# Patient Record
Sex: Female | Born: 1940
Health system: Southern US, Community
[De-identification: ages and names within clinical notes are randomized; demographics above are authoritative.]

## PROBLEM LIST (undated history)

## (undated) DIAGNOSIS — H269 Unspecified cataract: Secondary | ICD-10-CM

## (undated) DIAGNOSIS — K219 Gastro-esophageal reflux disease without esophagitis: Secondary | ICD-10-CM

## (undated) DIAGNOSIS — E7800 Pure hypercholesterolemia, unspecified: Secondary | ICD-10-CM

## (undated) HISTORY — PX: WISDOM TOOTH EXTRACTION: SHX21

## (undated) HISTORY — DX: Unspecified cataract: H26.9

## (undated) HISTORY — PX: TONSILLECTOMY: SUR1361

## (undated) HISTORY — DX: Gastro-esophageal reflux disease without esophagitis: K21.9

## (undated) HISTORY — DX: Pure hypercholesterolemia, unspecified: E78.00

## (undated) HISTORY — PX: CATARACT EXTRACTION: SUR2

## (undated) HISTORY — PX: EYE SURGERY: SHX253

## (undated) HISTORY — PX: COLONOSCOPY: SHX174

---

## 2003-10-30 ENCOUNTER — Emergency Department (HOSPITAL_COMMUNITY): Admission: EM | Admit: 2003-10-30 | Discharge: 2003-10-30 | Payer: Self-pay | Admitting: Family Medicine

## 2005-12-25 ENCOUNTER — Other Ambulatory Visit: Admission: RE | Admit: 2005-12-25 | Discharge: 2005-12-25 | Payer: Self-pay | Admitting: Gynecology

## 2008-04-09 ENCOUNTER — Ambulatory Visit: Payer: Self-pay | Admitting: Women's Health

## 2008-04-09 ENCOUNTER — Other Ambulatory Visit: Admission: RE | Admit: 2008-04-09 | Discharge: 2008-04-09 | Payer: Self-pay | Admitting: Obstetrics and Gynecology

## 2008-04-09 ENCOUNTER — Encounter: Payer: Self-pay | Admitting: Women's Health

## 2008-05-03 ENCOUNTER — Ambulatory Visit (HOSPITAL_COMMUNITY): Admission: RE | Admit: 2008-05-03 | Discharge: 2008-05-03 | Payer: Self-pay | Admitting: Gynecology

## 2009-05-04 ENCOUNTER — Ambulatory Visit (HOSPITAL_COMMUNITY): Admission: RE | Admit: 2009-05-04 | Discharge: 2009-05-04 | Payer: Self-pay | Admitting: Internal Medicine

## 2010-12-21 ENCOUNTER — Other Ambulatory Visit: Payer: Self-pay | Admitting: Women's Health

## 2010-12-21 ENCOUNTER — Encounter (INDEPENDENT_AMBULATORY_CARE_PROVIDER_SITE_OTHER): Payer: Medicare HMO | Admitting: Women's Health

## 2010-12-21 ENCOUNTER — Other Ambulatory Visit (HOSPITAL_COMMUNITY)
Admission: RE | Admit: 2010-12-21 | Discharge: 2010-12-21 | Disposition: A | Discharge status: Home / Self Care | Payer: Medicare HMO | Source: Ambulatory Visit | Attending: Obstetrics and Gynecology | Admitting: Obstetrics and Gynecology

## 2010-12-21 DIAGNOSIS — N951 Menopausal and female climacteric states: Secondary | ICD-10-CM

## 2010-12-21 DIAGNOSIS — Z124 Encounter for screening for malignant neoplasm of cervix: Secondary | ICD-10-CM | POA: Insufficient documentation

## 2010-12-25 ENCOUNTER — Other Ambulatory Visit: Payer: Self-pay | Admitting: Women's Health

## 2010-12-25 DIAGNOSIS — Z1231 Encounter for screening mammogram for malignant neoplasm of breast: Secondary | ICD-10-CM

## 2010-12-26 ENCOUNTER — Encounter: Payer: Self-pay | Admitting: *Deleted

## 2011-01-02 ENCOUNTER — Ambulatory Visit (HOSPITAL_COMMUNITY)
Admission: RE | Admit: 2011-01-02 | Discharge: 2011-01-02 | Disposition: A | Discharge status: Home / Self Care | Payer: Medicare HMO | Source: Ambulatory Visit | Attending: Women's Health | Admitting: Women's Health

## 2011-01-02 DIAGNOSIS — Z1231 Encounter for screening mammogram for malignant neoplasm of breast: Secondary | ICD-10-CM | POA: Insufficient documentation

## 2012-03-31 ENCOUNTER — Other Ambulatory Visit (HOSPITAL_COMMUNITY): Payer: Self-pay | Admitting: Internal Medicine

## 2012-03-31 DIAGNOSIS — Z1231 Encounter for screening mammogram for malignant neoplasm of breast: Secondary | ICD-10-CM

## 2012-04-04 ENCOUNTER — Encounter: Payer: Self-pay | Admitting: Internal Medicine

## 2012-04-17 ENCOUNTER — Ambulatory Visit (HOSPITAL_COMMUNITY)
Admission: RE | Admit: 2012-04-17 | Discharge: 2012-04-17 | Disposition: A | Discharge status: Home / Self Care | Payer: Medicare Other | Source: Ambulatory Visit | Attending: Internal Medicine | Admitting: Internal Medicine

## 2012-04-17 DIAGNOSIS — Z1231 Encounter for screening mammogram for malignant neoplasm of breast: Secondary | ICD-10-CM | POA: Insufficient documentation

## 2012-05-13 ENCOUNTER — Encounter: Payer: Self-pay | Admitting: Internal Medicine

## 2012-05-13 ENCOUNTER — Ambulatory Visit (AMBULATORY_SURGERY_CENTER): Payer: Medicare Other | Admitting: *Deleted

## 2012-05-13 VITALS — Ht 65.0 in | Wt 164.0 lb

## 2012-05-13 DIAGNOSIS — Z1211 Encounter for screening for malignant neoplasm of colon: Secondary | ICD-10-CM

## 2012-05-13 DIAGNOSIS — Z800 Family history of malignant neoplasm of digestive organs: Secondary | ICD-10-CM

## 2012-05-13 MED ORDER — NA SULFATE-K SULFATE-MG SULF 17.5-3.13-1.6 GM/177ML PO SOLN: ORAL | Status: DC

## 2012-05-13 NOTE — Progress Notes (Signed)
Pt had 2 colonoscopies in [place], [place].  Ok to get info from previous procedure received and form given to [place], Dr. Marvell Fuller CMA

## 2012-05-27 ENCOUNTER — Encounter: Payer: Self-pay | Admitting: Internal Medicine

## 2012-05-27 ENCOUNTER — Ambulatory Visit (AMBULATORY_SURGERY_CENTER): Payer: Medicare Other | Admitting: Internal Medicine

## 2012-05-27 VITALS — BP 133/70 | HR 57 | Temp 96.9°F | Resp 26 | Ht 65.0 in | Wt 164.0 lb

## 2012-05-27 DIAGNOSIS — K648 Other hemorrhoids: Secondary | ICD-10-CM

## 2012-05-27 DIAGNOSIS — Z1211 Encounter for screening for malignant neoplasm of colon: Secondary | ICD-10-CM

## 2012-05-27 MED ORDER — SODIUM CHLORIDE 0.9 % IV SOLN: 500.0000 mL | INTRAVENOUS | Status: DC

## 2012-05-27 MED ORDER — HYDROCORTISONE 2.5 % RE CREA: TOPICAL_CREAM | Freq: Two times a day (BID) | RECTAL | Status: DC

## 2012-05-27 NOTE — Patient Instructions (Addendum)
No polyps or cancer seen!  You do have an inflamed internal hemorrhoid. I have prescribed medication to relieve that.   You can consider another routine colonoscopy in 10 years but at 29 that may not be necessary.  Thank you for choosing me and [facility].  Iva Boop, MD, FACG    YOU HAD AN ENDOSCOPIC PROCEDURE TODAY AT THE [facility]: Refer to the procedure report that was given to you for any specific questions about what was found during the examination.  If the procedure report does not answer your questions, please call your gastroenterologist to clarify.  If you requested that your care partner not be given the details of your procedure findings, then the procedure report has been included in a sealed envelope for you to review at your convenience later.  YOU SHOULD EXPECT: Some feelings of bloating in the abdomen. Passage of more gas than usual.  Walking can help get rid of the air that was put into your GI tract during the procedure and reduce the bloating. If you had a lower endoscopy (such as a colonoscopy or flexible sigmoidoscopy) you may notice spotting of blood in your stool or on the toilet paper. If you underwent a bowel prep for your procedure, then you may not have a normal bowel movement for a few days.  DIET: Your first meal following the procedure should be a light meal and then it is ok to progress to your normal diet.  A half-sandwich or bowl of soup is an example of a good first meal.  Heavy or fried foods are harder to digest and may make you feel nauseous or bloated.  Likewise meals heavy in dairy and vegetables can cause extra gas to form and this can also increase the bloating.  Drink plenty of fluids but you should avoid alcoholic beverages for 24 hours.  ACTIVITY: Your care partner should take you home directly after the procedure.  You should plan to take it easy, moving slowly for the rest of the day.  You can resume normal activity  the day after the procedure however you should NOT DRIVE or use heavy machinery for 24 hours (because of the sedation medicines used during the test).    SYMPTOMS TO REPORT IMMEDIATELY: A gastroenterologist can be reached at any hour.  During normal business hours, 8:30 AM to 5:00 PM Monday through Friday, call [phone].  After hours and on weekends, please call the GI answering service at [phone] who will take a message and have the physician on call contact you.   Following lower endoscopy (colonoscopy or flexible sigmoidoscopy):  Excessive amounts of blood in the stool  Significant tenderness or worsening of abdominal pains  Swelling of the abdomen that is new, acute  Fever of 100F or higher   FOLLOW UP: If any biopsies were taken you will be contacted by phone or by letter within the next 1-3 weeks.  Call your gastroenterologist if you have not heard about the biopsies in 3 weeks.  Our staff will call the home number listed on your records the next business day following your procedure to check on you and address any questions or concerns that you may have at that time regarding the information given to you following your procedure. This is a courtesy call and so if there is no answer at the home number and we have not heard from you through the emergency physician on call, we will assume that you have  returned to your regular daily activities without incident.  SIGNATURES/CONFIDENTIALITY: You and/or your care partner have signed paperwork which will be entered into your electronic medical record.  These signatures attest to the fact that that the information above on your After Visit Summary has been reviewed and is understood.  Full responsibility of the confidentiality of this discharge information lies with you and/or your care-partner.

## 2012-05-27 NOTE — Op Note (Signed)
[facility] [address] [place], [address]   COLONOSCOPY PROCEDURE REPORT  PATIENT: Jamie Duncan, Jamie Duncan  MR#: [id number] BIRTHDATE: 25-Oct-1940 , 71  yrs. old GENDER: Female ENDOSCOPIST: Iva Boop, MD, [facility] REFERRED XB:[id number] Tisovec, M.D. PROCEDURE DATE:  05/27/2012 PROCEDURE:   Colonoscopy, diagnostic ASA CLASS:   Class II INDICATIONS:average risk screening. MEDICATIONS: MAC sedation, administered by CRNA, These medications were titrated to patient response per physician's verbal order, and propofol (Diprivan) 200mg  IV  DESCRIPTION OF PROCEDURE:   After the risks benefits and alternatives of the procedure were thoroughly explained, informed consent was obtained.  A digital rectal exam revealed internal hemorrhoids.   The LB [id number]-[id number]  endoscope was introduced through the anus and advanced to the cecum, which was identified by both the appendix and ileocecal valve. No adverse events experienced.   The quality of the prep was Suprep excellent  The instrument was then slowly withdrawn as the colon was fully examined.      COLON FINDINGS: Moderate sized internal hemorrhoids were found with one inflamed.   The colonic mucosa appeared normal throughout the entire examined colon.   A right colon retroflexion was performed. Retroflexed views revealed internal hemorrhoids. The time to cecum=6 minutes 12 seconds.  Withdrawal time=8 minutes 10 seconds. The scope was withdrawn and the procedure completed. COMPLICATIONS: There were no complications.  ENDOSCOPIC IMPRESSION: 1.   Moderate sized internal hemorrhoids in the rectum with inflamed hemorrhoid 2.   The colonic mucosa appeared normal throughout the entire examined colon   RECOMMENDATIONS: 1. Consider repeat Colonscopy in 10 years but will be 81. Note - her mother was in 18's when colon cancer diagnosed so this patient does not have an increased risk of colon cancer - that occurs when  colon cancer in family < 60 yrs 2. Treat hemorrhoids with hydrocortisone cream and lifestyle changes (fiber, hydration, avoid excessive straining)   eSigned:  Iva Boop, MD, [facility] 05/27/2012 12:09 PM cc: Guerry Bruin, MD and The Patient

## 2012-05-27 NOTE — Progress Notes (Signed)
Patient did not experience any of the following events: a burn prior to discharge; a fall within the facility; wrong site/side/patient/procedure/implant event; or a hospital transfer or hospital admission upon discharge from the facility. (G8907) Patient did not have preoperative order for IV antibiotic SSI prophylaxis. (G8918)  

## 2012-05-27 NOTE — Progress Notes (Signed)
Propofol given over incremental dosages 

## 2012-05-28 ENCOUNTER — Telehealth: Payer: Self-pay

## 2012-05-28 NOTE — Telephone Encounter (Signed)
  Follow up Call-  Call back number 05/27/2012  Post procedure Call Back phone  # [phone]  Permission to leave phone message Yes     Patient questions:  Do you have a fever, pain , or abdominal swelling? no Pain Score  0 *  Have you tolerated food without any problems? yes  Have you been able to return to your normal activities? yes  Do you have any questions about your discharge instructions: Diet   no Medications  no Follow up visit  no  Do you have questions or concerns about your Care? no  Actions: * If pain score is 4 or above: No action needed, pain <4.   No problems per the pt. Maw

## 2012-06-19 ENCOUNTER — Encounter: Payer: Medicare Other | Admitting: Women's Health

## 2012-06-20 ENCOUNTER — Ambulatory Visit (INDEPENDENT_AMBULATORY_CARE_PROVIDER_SITE_OTHER): Payer: Medicare Other | Admitting: Women's Health

## 2012-06-20 ENCOUNTER — Encounter: Payer: Self-pay | Admitting: Women's Health

## 2012-06-20 VITALS — BP 116/66 | Ht 65.0 in | Wt 154.0 lb

## 2012-06-20 DIAGNOSIS — H269 Unspecified cataract: Secondary | ICD-10-CM | POA: Insufficient documentation

## 2012-06-20 DIAGNOSIS — K219 Gastro-esophageal reflux disease without esophagitis: Secondary | ICD-10-CM | POA: Insufficient documentation

## 2012-06-20 DIAGNOSIS — E7800 Pure hypercholesterolemia, unspecified: Secondary | ICD-10-CM | POA: Insufficient documentation

## 2012-06-20 DIAGNOSIS — N952 Postmenopausal atrophic vaginitis: Secondary | ICD-10-CM

## 2012-06-20 NOTE — Progress Notes (Signed)
Jamie Duncan 1940-10-11 [id number]    History:    The patient presents for breast and pelvic exam.   Postmenopausal with no bleeding/no HRT. Normal Pap and mammogram history. Hypercholesteremia treated by primary care with Zocor. Mother with history of colon cancer negative colonoscopy 05/2012. DEXA normal at primary care-informed bones of a 72 year old. Has had Pneumovax and zostavac vaccine's.  Past medical history, past surgical history, family history and social history were all reviewed and documented in the EPIC chart. Parents diabetes. Active lifestyle. Retired Airline pilot.  Exam:  Filed Vitals:   06/20/12 1356  BP: 116/66    General appearance:  Normal Head/Neck:  Normal, without cervical or supraclavicular adenopathy. Thyroid:  Symmetrical, normal in size, without palpable masses or nodularity. Respiratory  Effort:  Normal  Auscultation:  Clear without wheezing or rhonchi Cardiovascular  Auscultation:  Regular rate, without rubs, murmurs or gallops  Edema/varicosities:  Not grossly evident Abdominal  Soft,nontender, without masses, guarding or rebound.  Liver/spleen:  No organomegaly noted  Hernia:  None appreciated  Skin  Inspection:  Grossly normal  Palpation:  Grossly normal Neurologic/psychiatric  Orientation:  Normal with appropriate conversation.  Mood/affect:  Normal  Genitourinary    Breasts: Examined lying and sitting.     Right: Without masses, retractions, discharge or axillary adenopathy.     Left: Without masses, retractions, discharge or axillary adenopathy.   Inguinal/mons:  Normal without inguinal adenopathy  External genitalia:  Normal  BUS/Urethra/Skene's glands:  Normal  Bladder:  Normal  Vagina:  Normal  Cervix:  Normal  Uterus:  normal in size, shape and contour.  Midline and mobile  Adnexa/parametria:     Rt: Without masses or tenderness.   Lt: Without masses or tenderness.  Anus and perineum: Normal  Digital rectal exam: Normal  sphincter tone without palpated masses or tenderness  Assessment/Plan:  72 y.o. Jamie Duncan. G1 P1  for breast and pelvic exam with no complaints.   Normal postmenopausal exam/no HRT. Hypercholesteremia-primary care labs and meds  Plan: SBE's, continue annual mammogram, continue exercise and healthy lifestyle, calcium rich diet vitamin D 2000 daily encouraged. Home safety and fall prevention discussed. Pap normal 2012 , new screening guidelines reviewed. Vaginal lubricants and condoms encouraged if becomes sexually active.    Harrington Chall[facility], 5:27 PM 06/20/2012

## 2012-06-20 NOTE — Patient Instructions (Signed)

## 2013-11-27 ENCOUNTER — Other Ambulatory Visit: Payer: Self-pay | Admitting: Gynecology

## 2013-11-27 DIAGNOSIS — Z1231 Encounter for screening mammogram for malignant neoplasm of breast: Secondary | ICD-10-CM

## 2013-12-01 ENCOUNTER — Ambulatory Visit (HOSPITAL_COMMUNITY)
Admission: RE | Admit: 2013-12-01 | Discharge: 2013-12-01 | Disposition: A | Discharge status: Home / Self Care | Payer: Medicare Other | Source: Ambulatory Visit | Attending: Gynecology | Admitting: Gynecology

## 2013-12-01 DIAGNOSIS — Z1231 Encounter for screening mammogram for malignant neoplasm of breast: Secondary | ICD-10-CM

## 2013-12-10 ENCOUNTER — Encounter: Payer: Self-pay | Admitting: Women's Health

## 2013-12-10 ENCOUNTER — Ambulatory Visit (INDEPENDENT_AMBULATORY_CARE_PROVIDER_SITE_OTHER): Payer: Medicare HMO | Admitting: Women's Health

## 2013-12-10 VITALS — BP 110/72 | Ht 64.75 in | Wt 163.8 lb

## 2013-12-10 DIAGNOSIS — N952 Postmenopausal atrophic vaginitis: Secondary | ICD-10-CM

## 2013-12-10 NOTE — Patient Instructions (Signed)
Health Recommendations for Postmenopausal Women Respected and ongoing research has looked at the most common causes of death, disability, and poor quality of life in postmenopausal women. The causes include heart disease, diseases of blood vessels, diabetes, depression, cancer, and bone loss (osteoporosis). Many things can be done to help lower the chances of developing these and other common problems: CARDIOVASCULAR DISEASE Heart Disease: A heart attack is a medical emergency. Know the signs and symptoms of a heart attack. Below are things women can do to reduce their risk for heart disease.   Do not smoke. If you smoke, quit.  Aim for a healthy weight. Being overweight causes many preventable deaths. Eat a healthy and balanced diet and drink an adequate amount of liquids.  Get moving. Make a commitment to be more physically active. Aim for 30 minutes of activity on most, if not all days of the week.  Eat for heart health. Choose a diet that is low in saturated fat and cholesterol and eliminate trans fat. Include whole grains, vegetables, and fruits. Read and understand the labels on food containers before buying.  Know your numbers. Ask your caregiver to check your blood pressure, cholesterol (total, HDL, LDL, triglycerides) and blood glucose. Work with your caregiver on improving your entire clinical picture.  High blood pressure. Limit or stop your table salt intake (try salt substitute and food seasonings). Avoid salty foods and drinks. Read labels on food containers before buying. Eating well and exercising can help control high blood pressure. STROKE  Stroke is a medical emergency. Stroke may be the result of a blood clot in a blood vessel in the brain or by a brain hemorrhage (bleeding). Know the signs and symptoms of a stroke. To lower the risk of developing a stroke:  Avoid fatty foods.  Quit smoking.  Control your diabetes, blood pressure, and irregular heart rate. THROMBOPHLEBITIS  (BLOOD CLOT) OF THE LEG  Becoming overweight and leading a stationary lifestyle may also contribute to developing blood clots. Controlling your diet and exercising will help lower the risk of developing blood clots. CANCER SCREENING  Breast Cancer: Take steps to reduce your risk of breast cancer.  You should practice "breast self-awareness." This means understanding the normal appearance and feel of your breasts and should include breast self-examination. Any changes detected, no matter how small, should be reported to your caregiver.  After age 40, you should have a clinical breast exam (CBE) every year.  Starting at age 40, you should consider having a mammogram (breast X-ray) every year.  If you have a family history of breast cancer, talk to your caregiver about genetic screening.  If you are at high risk for breast cancer, talk to your caregiver about having an MRI and a mammogram every year.  Intestinal or Stomach Cancer: Tests to consider are a rectal exam, fecal occult blood, sigmoidoscopy, and colonoscopy. Women who are high risk may need to be screened at an earlier age and more often.  Cervical Cancer:  Beginning at age 30, you should have a Pap test every 3 years as long as the past 3 Pap tests have been normal.  If you have had past treatment for cervical cancer or a condition that could lead to cancer, you need Pap tests and screening for cancer for at least 20 years after your treatment.  If you had a hysterectomy for a problem that was not cancer or a condition that could lead to cancer, then you no longer need Pap tests.    If you are between ages 65 and 70, and you have had normal Pap tests going back 10 years, you no longer need Pap tests.  If Pap tests have been discontinued, risk factors (such as a new sexual partner) need to be reassessed to determine if screening should be resumed.  Some medical problems can increase the chance of getting cervical cancer. In these  cases, your caregiver may recommend more frequent screening and Pap tests.  Uterine Cancer: If you have vaginal bleeding after reaching menopause, you should notify your caregiver.  Ovarian cancer: Other than yearly pelvic exams, there are no reliable tests available to screen for ovarian cancer at this time except for yearly pelvic exams.  Lung Cancer: Yearly chest X-rays can detect lung cancer and should be done on high risk women, such as cigarette smokers and women with chronic lung disease (emphysema).  Skin Cancer: A complete body skin exam should be done at your yearly examination. Avoid overexposure to the sun and ultraviolet light lamps. Use a strong sun block cream when in the sun. All of these things are important in lowering the risk of skin cancer. MENOPAUSE Menopause Symptoms: Hormone therapy products are effective for treating symptoms associated with menopause:  Moderate to severe hot flashes.  Night sweats.  Mood swings.  Headaches.  Tiredness.  Loss of sex drive.  Insomnia.  Other symptoms. Hormone replacement carries certain risks, especially in older women. Women who use or are thinking about using estrogen or estrogen with progestin treatments should discuss that with their caregiver. Your caregiver will help you understand the benefits and risks. The ideal dose of hormone replacement therapy is not known. The Food and Drug Administration (FDA) has concluded that hormone therapy should be used only at the lowest doses and for the shortest amount of time to reach treatment goals.  OSTEOPOROSIS Protecting Against Bone Loss and Preventing Fracture: If you use hormone therapy for prevention of bone loss (osteoporosis), the risks for bone loss must outweigh the risk of the therapy. Ask your caregiver about other medications known to be safe and effective for preventing bone loss and fractures. To guard against bone loss or fractures, the following is recommended:  If  you are less than age 50, take 1000 mg of calcium and at least 600 mg of Vitamin D per day.  If you are greater than age 50 but less than age 70, take 1200 mg of calcium and at least 600 mg of Vitamin D per day.  If you are greater than age 70, take 1200 mg of calcium and at least 800 mg of Vitamin D per day. Smoking and excessive alcohol intake increases the risk of osteoporosis. Eat foods rich in calcium and vitamin D and do weight bearing exercises several times a week as your caregiver suggests. DIABETES Diabetes Melitus: If you have Type I or Type 2 diabetes, you should keep your blood sugar under control with diet, exercise and recommended medication. Avoid too many sweets, starchy and fatty foods. Being overweight can make control more difficult. COGNITION AND MEMORY Cognition and Memory: Menopausal hormone therapy is not recommended for the prevention of cognitive disorders such as Alzheimer's disease or memory loss.  DEPRESSION  Depression may occur at any age, but is common in elderly women. The reasons may be because of physical, medical, social (loneliness), or financial problems and needs. If you are experiencing depression because of medical problems and control of symptoms, talk to your caregiver about this. Physical activity and   exercise may help with mood and sleep. Community and volunteer involvement may help your sense of value and worth. If you have depression and you feel that the problem is getting worse or becoming severe, talk to your caregiver about treatment options that are best for you. ACCIDENTS  Accidents are common and can be serious in the elderly woman. Prepare your house to prevent accidents. Eliminate throw rugs, place hand bars in the bath, shower and toilet areas. Avoid wearing high heeled shoes or walking on wet, snowy, and icy areas. Limit or stop driving if you have vision or hearing problems, or you feel you are unsteady with you movements and  reflexes. HEPATITIS C Hepatitis C is a type of viral infection affecting the liver. It is spread mainly through contact with blood from an infected person. It can be treated, but if left untreated, it can lead to severe liver damage over years. Many people who are infected do not know that the virus is in their blood. If you are a "baby-boomer", it is recommended that you have one screening test for Hepatitis C. IMMUNIZATIONS  Several immunizations are important to consider having during your senior years, including:   Tetanus, diptheria, and pertussis booster shot.  Influenza every year before the flu season begins.  Pneumonia vaccine.  Shingles vaccine.  Others as indicated based on your specific needs. Talk to your caregiver about these. Document Released: 07/20/2005 Document Revised: 05/14/2012 Document Reviewed: 03/15/2008 [facility] Patient Information 2015 [place], [place]. This information is not intended to replace advice given to you by your health care provider. Make sure you discuss any questions you have with your health care provider.

## 2013-12-10 NOTE — Progress Notes (Signed)
Jamie Duncan [birth year] [id number]    History:    Presents for breast and pelvic exam. Postmenopausal with no bleeding/no HRT. Not sexually active. Normal Pap and mammogram history. Normal bone density at primary care reports  bones of a 73 year old. 2013 negative colonoscopy. Current on vaccines. Hypercholesteremia/GERD managed by primary care.  Past medical history, past surgical history, family history and social history were all reviewed and documented in the EPIC chart. 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 No grandchildren.  ROS:  A  12 point ROS was performed and pertinent positives and negatives are included.  Exam:  Filed Vitals:   12/10/13 1422  BP: 110/72    General appearance:  Normal Thyroid:  Symmetrical, normal in size, without palpable masses or nodularity. Respiratory  Auscultation:  Clear without wheezing or rhonchi Cardiovascular  Auscultation:  Regular rate, without rubs, murmurs or gallops  Edema/varicosities:  Not grossly evident Abdominal  Soft,nontender, without masses, guarding or rebound.  Liver/spleen:  No organomegaly noted  Hernia:  None appreciated  Skin  Inspection:  Grossly normal/numerous keratosis   Breasts: Examined lying and sitting.     Right: Without masses, retractions, discharge or axillary adenopathy.     Left: Without masses, retractions, discharge or axillary adenopathy. Gentitourinary   Inguinal/mons:  Normal without inguinal adenopathy  External genitalia:  Normal  BUS/Urethra/Skene's glands:  Normal  Vagina:  Atrophic  Cervix:  Normal  Uterus:   normal in size, shape and contour.  Midline and mobile  Adnexa/parametria:     Rt: Without masses or tenderness.   Lt: Without masses or tenderness.  Anus and perineum: Normal  Digital rectal exam: Normal sphincter tone without palpated masses or tenderness  Assessment/Plan:  73 y.o. WWF G1P1 for breast and pelvic exam.  Postmenopausal/no HRT/no bleeding Asymptomatic vaginal  atrophy Hypercholesteremia/GERD primary care manages labs and meds  Plan: SBE's, continue annual mammogram, calcium rich diet, vitamin D 2000 daily encouraged. Home safety and fall prevention discussed. Normal 2012, new screening guidelines reviewed.   Note: This dictation was prepared with Dragon/digital dictation.  Any transcriptional errors that result are unintentional. [name] [facility], 3:09 PM 12/10/2013

## 2014-04-12 ENCOUNTER — Encounter: Payer: Self-pay | Admitting: Women's Health

## 2014-11-03 ENCOUNTER — Other Ambulatory Visit (HOSPITAL_COMMUNITY): Payer: Self-pay | Admitting: Internal Medicine

## 2014-11-03 DIAGNOSIS — Z1231 Encounter for screening mammogram for malignant neoplasm of breast: Secondary | ICD-10-CM

## 2014-12-03 ENCOUNTER — Ambulatory Visit (HOSPITAL_COMMUNITY)
Admission: RE | Admit: 2014-12-03 | Discharge: 2014-12-03 | Disposition: A | Discharge status: Home / Self Care | Payer: Medicare HMO | Source: Ambulatory Visit | Attending: Internal Medicine | Admitting: Internal Medicine

## 2014-12-03 DIAGNOSIS — Z1231 Encounter for screening mammogram for malignant neoplasm of breast: Secondary | ICD-10-CM | POA: Diagnosis present

## 2015-04-21 DIAGNOSIS — Z0000 Encounter for general adult medical examination without abnormal findings: Secondary | ICD-10-CM | POA: Diagnosis not present

## 2015-04-21 DIAGNOSIS — E784 Other hyperlipidemia: Secondary | ICD-10-CM | POA: Diagnosis not present

## 2015-04-21 DIAGNOSIS — R7301 Impaired fasting glucose: Secondary | ICD-10-CM | POA: Diagnosis not present

## 2015-04-21 DIAGNOSIS — E785 Hyperlipidemia, unspecified: Secondary | ICD-10-CM | POA: Diagnosis not present

## 2015-04-21 DIAGNOSIS — E559 Vitamin D deficiency, unspecified: Secondary | ICD-10-CM | POA: Diagnosis not present

## 2015-04-28 DIAGNOSIS — Z1389 Encounter for screening for other disorder: Secondary | ICD-10-CM | POA: Diagnosis not present

## 2015-04-28 DIAGNOSIS — E875 Hyperkalemia: Secondary | ICD-10-CM | POA: Diagnosis not present

## 2015-04-28 DIAGNOSIS — R7301 Impaired fasting glucose: Secondary | ICD-10-CM | POA: Diagnosis not present

## 2015-04-28 DIAGNOSIS — M859 Disorder of bone density and structure, unspecified: Secondary | ICD-10-CM | POA: Diagnosis not present

## 2015-04-28 DIAGNOSIS — Z0000 Encounter for general adult medical examination without abnormal findings: Secondary | ICD-10-CM | POA: Diagnosis not present

## 2015-04-28 DIAGNOSIS — Z800 Family history of malignant neoplasm of digestive organs: Secondary | ICD-10-CM | POA: Diagnosis not present

## 2015-04-28 DIAGNOSIS — K219 Gastro-esophageal reflux disease without esophagitis: Secondary | ICD-10-CM | POA: Diagnosis not present

## 2015-04-28 DIAGNOSIS — E559 Vitamin D deficiency, unspecified: Secondary | ICD-10-CM | POA: Diagnosis not present

## 2015-04-28 DIAGNOSIS — Z6828 Body mass index (BMI) 28.0-28.9, adult: Secondary | ICD-10-CM | POA: Diagnosis not present

## 2015-04-28 DIAGNOSIS — E7800 Pure hypercholesterolemia, unspecified: Secondary | ICD-10-CM | POA: Diagnosis not present

## 2015-05-04 DIAGNOSIS — Z1212 Encounter for screening for malignant neoplasm of rectum: Secondary | ICD-10-CM | POA: Diagnosis not present

## 2015-09-07 DIAGNOSIS — H35371 Puckering of macula, right eye: Secondary | ICD-10-CM | POA: Diagnosis not present

## 2015-09-07 DIAGNOSIS — Z0100 Encounter for examination of eyes and vision without abnormal findings: Secondary | ICD-10-CM | POA: Diagnosis not present

## 2015-09-07 DIAGNOSIS — H25013 Cortical age-related cataract, bilateral: Secondary | ICD-10-CM | POA: Diagnosis not present

## 2015-09-07 DIAGNOSIS — H2513 Age-related nuclear cataract, bilateral: Secondary | ICD-10-CM | POA: Diagnosis not present

## 2015-09-14 DIAGNOSIS — H35371 Puckering of macula, right eye: Secondary | ICD-10-CM | POA: Diagnosis not present

## 2015-10-12 DIAGNOSIS — H547 Unspecified visual loss: Secondary | ICD-10-CM | POA: Diagnosis not present

## 2015-10-12 DIAGNOSIS — H35371 Puckering of macula, right eye: Secondary | ICD-10-CM | POA: Diagnosis not present

## 2015-10-20 DIAGNOSIS — H35371 Puckering of macula, right eye: Secondary | ICD-10-CM | POA: Diagnosis not present

## 2015-10-20 DIAGNOSIS — Z09 Encounter for follow-up examination after completed treatment for conditions other than malignant neoplasm: Secondary | ICD-10-CM | POA: Diagnosis not present

## 2015-11-18 DIAGNOSIS — Z6827 Body mass index (BMI) 27.0-27.9, adult: Secondary | ICD-10-CM | POA: Diagnosis not present

## 2015-11-18 DIAGNOSIS — M545 Low back pain: Secondary | ICD-10-CM | POA: Diagnosis not present

## 2015-12-15 DIAGNOSIS — H35371 Puckering of macula, right eye: Secondary | ICD-10-CM | POA: Diagnosis not present

## 2015-12-15 DIAGNOSIS — Z09 Encounter for follow-up examination after completed treatment for conditions other than malignant neoplasm: Secondary | ICD-10-CM | POA: Diagnosis not present

## 2015-12-19 DIAGNOSIS — D225 Melanocytic nevi of trunk: Secondary | ICD-10-CM | POA: Diagnosis not present

## 2015-12-19 DIAGNOSIS — L720 Epidermal cyst: Secondary | ICD-10-CM | POA: Diagnosis not present

## 2015-12-19 DIAGNOSIS — D1801 Hemangioma of skin and subcutaneous tissue: Secondary | ICD-10-CM | POA: Diagnosis not present

## 2015-12-19 DIAGNOSIS — L918 Other hypertrophic disorders of the skin: Secondary | ICD-10-CM | POA: Diagnosis not present

## 2015-12-19 DIAGNOSIS — B353 Tinea pedis: Secondary | ICD-10-CM | POA: Diagnosis not present

## 2015-12-19 DIAGNOSIS — L820 Inflamed seborrheic keratosis: Secondary | ICD-10-CM | POA: Diagnosis not present

## 2015-12-19 DIAGNOSIS — L821 Other seborrheic keratosis: Secondary | ICD-10-CM | POA: Diagnosis not present

## 2015-12-19 DIAGNOSIS — L814 Other melanin hyperpigmentation: Secondary | ICD-10-CM | POA: Diagnosis not present

## 2015-12-19 DIAGNOSIS — B351 Tinea unguium: Secondary | ICD-10-CM | POA: Diagnosis not present

## 2016-01-20 ENCOUNTER — Other Ambulatory Visit: Payer: Self-pay | Admitting: Internal Medicine

## 2016-01-20 DIAGNOSIS — Z1231 Encounter for screening mammogram for malignant neoplasm of breast: Secondary | ICD-10-CM

## 2016-01-27 ENCOUNTER — Ambulatory Visit
Admission: RE | Admit: 2016-01-27 | Discharge: 2016-01-27 | Disposition: A | Discharge status: Home / Self Care | Payer: Medicare HMO | Source: Ambulatory Visit | Attending: Internal Medicine | Admitting: Internal Medicine

## 2016-01-27 DIAGNOSIS — Z1231 Encounter for screening mammogram for malignant neoplasm of breast: Secondary | ICD-10-CM

## 2016-02-14 ENCOUNTER — Ambulatory Visit (INDEPENDENT_AMBULATORY_CARE_PROVIDER_SITE_OTHER): Payer: Medicare HMO | Admitting: Women's Health

## 2016-02-14 ENCOUNTER — Encounter: Payer: Self-pay | Admitting: Women's Health

## 2016-02-14 VITALS — BP 124/80 | Ht 64.0 in | Wt 168.0 lb

## 2016-02-14 DIAGNOSIS — Z01419 Encounter for gynecological examination (general) (routine) without abnormal findings: Secondary | ICD-10-CM | POA: Diagnosis not present

## 2016-02-14 DIAGNOSIS — N952 Postmenopausal atrophic vaginitis: Secondary | ICD-10-CM

## 2016-02-14 NOTE — Progress Notes (Signed)
Jamie Duncan Jan 09, 1941 [id number]    History:    Presents for breast and pelvic exam with no complaints. Postmenopausal on no HRT with no bleeding. Not sexually active, widow. Normal Pap and mammogram history. DEXA at primary care reports as normal. 2013 negative colonoscopy. Current on vaccines. Hypercholesterolemia and GERD managed at primary care.   Past medical history, past surgical history, family history and social history were all reviewed and documented in the EPIC chart. 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  ROS:  A ROS was performed and pertinent positives and negatives are included.  Exam:  Vitals:   02/14/16 1407  BP: 124/80  Weight: 168 lb (76.2 kg)  Height: 5\' 4"  (1.626 m)   Body mass index is 28.84 kg/m.   General appearance:  Normal Thyroid:  Symmetrical, normal in size, without palpable masses or nodularity. Respiratory  Auscultation:  Clear without wheezing or rhonchi Cardiovascular  Auscultation:  Regular rate, without rubs, murmurs or gallops  Edema/varicosities:  Not grossly evident Abdominal  Soft,nontender, without masses, guarding or rebound.  Liver/spleen:  No organomegaly noted  Hernia:  None appreciated  Skin  Inspection:  Grossly normal   Breasts: Examined lying and sitting.     Right: Without masses, retractions, discharge or axillary adenopathy.     Left: Without masses, retractions, discharge or axillary adenopathy. Gentitourinary   Inguinal/mons:  Normal without inguinal adenopathy  External genitalia:  Left perineum firm 2 cm sebaceous cyst nontender reports no change  BUS/Urethra/Skene's glands:  Normal  Vagina:  Atrophic  Cervix:  Normal  Uterus:   normal in size, shape and contour.  Midline and mobile  Adnexa/parametria:     Rt: Without masses or tenderness.   Lt: Without masses or tenderness.  Anus and perineum: Normal  Digital rectal exam: Normal sphincter tone without palpated masses  or tenderness  Assessment/Plan:  75 y.o. WWF G1 P1 for breast and pelvic exam with no complaints.  Postmenopausal/no HRT/no bleeding Hypercholesterolemia and GERD-primary care manages labs and meds Atrophic vaginitis-asymptomatic Sebaceous cyst perineum declines treatment today Annual dermatology exam-aware of sebaceous cyst  Plan: Continue annual skin checks. SBE's, annual screening mammogram, calcium rich diet, instructed to have vitamin D level checked at primary care. Home safety, fall prevention and importance of weightbearing exercise reviewed.    [place], 5:29 PM 02/14/2016

## 2016-02-14 NOTE — Patient Instructions (Signed)

## 2016-03-15 DIAGNOSIS — M859 Disorder of bone density and structure, unspecified: Secondary | ICD-10-CM | POA: Diagnosis not present

## 2016-03-15 DIAGNOSIS — Z23 Encounter for immunization: Secondary | ICD-10-CM | POA: Diagnosis not present

## 2016-03-21 DIAGNOSIS — B351 Tinea unguium: Secondary | ICD-10-CM | POA: Diagnosis not present

## 2016-03-21 DIAGNOSIS — B353 Tinea pedis: Secondary | ICD-10-CM | POA: Diagnosis not present

## 2016-03-21 DIAGNOSIS — L820 Inflamed seborrheic keratosis: Secondary | ICD-10-CM | POA: Diagnosis not present

## 2016-04-04 DIAGNOSIS — H1030 Unspecified acute conjunctivitis, unspecified eye: Secondary | ICD-10-CM | POA: Diagnosis not present

## 2016-04-04 DIAGNOSIS — J209 Acute bronchitis, unspecified: Secondary | ICD-10-CM | POA: Diagnosis not present

## 2016-04-23 DIAGNOSIS — R7301 Impaired fasting glucose: Secondary | ICD-10-CM | POA: Diagnosis not present

## 2016-04-23 DIAGNOSIS — E7800 Pure hypercholesterolemia, unspecified: Secondary | ICD-10-CM | POA: Diagnosis not present

## 2016-04-23 DIAGNOSIS — E559 Vitamin D deficiency, unspecified: Secondary | ICD-10-CM | POA: Diagnosis not present

## 2016-04-30 DIAGNOSIS — E559 Vitamin D deficiency, unspecified: Secondary | ICD-10-CM | POA: Diagnosis not present

## 2016-04-30 DIAGNOSIS — Z0000 Encounter for general adult medical examination without abnormal findings: Secondary | ICD-10-CM | POA: Diagnosis not present

## 2016-04-30 DIAGNOSIS — M859 Disorder of bone density and structure, unspecified: Secondary | ICD-10-CM | POA: Diagnosis not present

## 2016-04-30 DIAGNOSIS — K219 Gastro-esophageal reflux disease without esophagitis: Secondary | ICD-10-CM | POA: Diagnosis not present

## 2016-04-30 DIAGNOSIS — Z1389 Encounter for screening for other disorder: Secondary | ICD-10-CM | POA: Diagnosis not present

## 2016-04-30 DIAGNOSIS — R7301 Impaired fasting glucose: Secondary | ICD-10-CM | POA: Diagnosis not present

## 2016-04-30 DIAGNOSIS — E875 Hyperkalemia: Secondary | ICD-10-CM | POA: Diagnosis not present

## 2016-04-30 DIAGNOSIS — Z6827 Body mass index (BMI) 27.0-27.9, adult: Secondary | ICD-10-CM | POA: Diagnosis not present

## 2016-04-30 DIAGNOSIS — Z800 Family history of malignant neoplasm of digestive organs: Secondary | ICD-10-CM | POA: Diagnosis not present

## 2016-04-30 DIAGNOSIS — E7800 Pure hypercholesterolemia, unspecified: Secondary | ICD-10-CM | POA: Diagnosis not present

## 2016-05-14 DIAGNOSIS — Z1212 Encounter for screening for malignant neoplasm of rectum: Secondary | ICD-10-CM | POA: Diagnosis not present

## 2016-05-14 DIAGNOSIS — Z1211 Encounter for screening for malignant neoplasm of colon: Secondary | ICD-10-CM | POA: Diagnosis not present

## 2016-06-11 HISTORY — PX: CATARACT EXTRACTION: SUR2

## 2016-09-06 DIAGNOSIS — H524 Presbyopia: Secondary | ICD-10-CM | POA: Diagnosis not present

## 2016-09-06 DIAGNOSIS — H5213 Myopia, bilateral: Secondary | ICD-10-CM | POA: Diagnosis not present

## 2016-09-06 DIAGNOSIS — H2512 Age-related nuclear cataract, left eye: Secondary | ICD-10-CM | POA: Diagnosis not present

## 2016-09-17 DIAGNOSIS — J209 Acute bronchitis, unspecified: Secondary | ICD-10-CM | POA: Diagnosis not present

## 2016-09-24 DIAGNOSIS — H43813 Vitreous degeneration, bilateral: Secondary | ICD-10-CM | POA: Diagnosis not present

## 2016-09-24 DIAGNOSIS — H25013 Cortical age-related cataract, bilateral: Secondary | ICD-10-CM | POA: Diagnosis not present

## 2016-09-24 DIAGNOSIS — H2513 Age-related nuclear cataract, bilateral: Secondary | ICD-10-CM | POA: Diagnosis not present

## 2016-10-18 DIAGNOSIS — H25811 Combined forms of age-related cataract, right eye: Secondary | ICD-10-CM | POA: Diagnosis not present

## 2016-10-18 DIAGNOSIS — H2511 Age-related nuclear cataract, right eye: Secondary | ICD-10-CM | POA: Diagnosis not present

## 2016-10-18 DIAGNOSIS — H25011 Cortical age-related cataract, right eye: Secondary | ICD-10-CM | POA: Diagnosis not present

## 2016-10-24 ENCOUNTER — Encounter: Payer: Self-pay | Admitting: Gynecology

## 2016-11-22 DIAGNOSIS — Z961 Presence of intraocular lens: Secondary | ICD-10-CM | POA: Diagnosis not present

## 2017-02-06 ENCOUNTER — Other Ambulatory Visit: Payer: Self-pay | Admitting: Internal Medicine

## 2017-02-06 DIAGNOSIS — Z1231 Encounter for screening mammogram for malignant neoplasm of breast: Secondary | ICD-10-CM

## 2017-02-13 ENCOUNTER — Ambulatory Visit
Admission: RE | Admit: 2017-02-13 | Discharge: 2017-02-13 | Disposition: A | Discharge status: Home / Self Care | Payer: Medicare HMO | Source: Ambulatory Visit | Attending: Internal Medicine | Admitting: Internal Medicine

## 2017-02-13 DIAGNOSIS — Z1231 Encounter for screening mammogram for malignant neoplasm of breast: Secondary | ICD-10-CM | POA: Diagnosis not present

## 2017-02-26 ENCOUNTER — Ambulatory Visit (INDEPENDENT_AMBULATORY_CARE_PROVIDER_SITE_OTHER): Payer: Medicare HMO | Admitting: Women's Health

## 2017-02-26 ENCOUNTER — Encounter: Payer: Self-pay | Admitting: Women's Health

## 2017-02-26 VITALS — BP 134/80 | Ht 64.0 in | Wt 159.0 lb

## 2017-02-26 DIAGNOSIS — L723 Sebaceous cyst: Secondary | ICD-10-CM | POA: Diagnosis not present

## 2017-02-26 DIAGNOSIS — Z01411 Encounter for gynecological examination (general) (routine) with abnormal findings: Secondary | ICD-10-CM

## 2017-02-26 NOTE — Progress Notes (Signed)
Jamie Duncan 01-Aug-1940 [id number]    History:    Presents for breast and pelvic exam with complaint of the painless perineal cyst. Postmenopausal on no HRT with no bleeding. Normal Pap and mammogram history. 2013 negative colonoscopy. Primary care manages DEXA, mild osteopenia. Not sexually active/widow. Primary care manages hypercholesterolemia and GERD. Had a cataract removed last year doing well.   Past medical history, past surgical history, family history and social history were all reviewed and documented in the EPIC chart. Recently adopted a Hotel manager Yogi. Retired Optometrist. One daughter no grandchildren.  ROS:  A ROS was performed and pertinent positives and negatives are included.  Exam:  Vitals:   02/26/17 1402  BP: 134/80  Weight: 159 lb (72.1 kg)  Height: 5\' 4"  (1.626 m)   Body mass index is 27.29 kg/m.   General appearance:  Normal Thyroid:  Symmetrical, normal in size, without palpable masses or nodularity. Respiratory  Auscultation:  Clear without wheezing or rhonchi Cardiovascular  Auscultation:  Regular rate, without rubs, murmurs or gallops  Edema/varicosities:  Not grossly evident Abdominal  Soft,nontender, without masses, guarding or rebound.  Liver/spleen:  No organomegaly noted  Hernia:  None appreciated  Skin  Inspection:  Grossly normal   Breasts: Examined lying and sitting.     Right: Without masses, retractions, discharge or axillary adenopathy.     Left: Without masses, retractions, discharge or axillary adenopathy. Gentitourinary   Inguinal/mons:  Normal without inguinal adenopathy  External genitalia: Left labia majora 2 cm firm sebaceous cyst,              BUS/Urethra/Skene's glands:  Normal  Vagina:  Mild atrophy  Cervix:  Normal  Uterus:  normal in size, shape and contour.  Midline and mobile  Adnexa/parametria:     Rt: Without masses or tenderness.   Lt: Without masses or tenderness.  Anus and perineum: Normal  Digital rectal  exam: Normal sphincter tone without palpated masses or tenderness  Assessment/Plan:  76 y.o.  WWF G1 P1 for breast and pelvic exam.  Postmenopausal/no HRT/no bleeding Hypercholesteremia/GERD/osteopenia-primary care manages labs and meds Left labia majora 2 cm sebaceous cyst  Plan: SBE's, exercise, calcium rich diet, vitamin D 2000 daily encouraged. Home safety, fall prevention and importance of weightbearing exercise reviewed. Sebaceous cyst cleansed with Betadine, 5 cc of Xylocaine 1%, opened with #11,  white firm dry material exuded scant bleeding area flat. Instructed to keep open to air, Neosporin at  bedtime, loose clothing. Instructed to call if any problems.      [name] [facility], 2:52 PM 02/26/2017

## 2017-02-26 NOTE — Patient Instructions (Signed)
Epidermal Cyst Removal, Care After Refer to this sheet in the next few weeks. These instructions provide you with information about caring for yourself after your procedure. Your health care provider may also give you more specific instructions. Your treatment has been planned according to current medical practices, but problems sometimes occur. Call your health care provider if you have any problems or questions after your procedure. What can I expect after the procedure? After the procedure, it is common to have:  Soreness in the area where your cyst was removed.  Tightness or itching from your skin sutures.  Follow these instructions at home:  Take medicines only as directed by your health care provider.  If you were prescribed an antibiotic medicine, finish all of it even if you start to feel better.  Use antibiotic ointment as directed by your health care provider. Follow the instructions carefully.  There are many different ways to close and cover an incision, including stitches (sutures), skin glue, and adhesive strips. Follow your health care provider's instructions about: ? Incision care. ? Bandage (dressing) changes and removal. ? Incision closure removal.  Keep the bandage (dressing) dry until your health care provider says that it can be removed. Take sponge baths only. Ask your health care provider when you can start showering or taking a bath.  After your dressing is off, check your incision every day for signs of infection. Watch for: ? Redness, swelling, or pain. ? Fluid, blood, or pus.  You can return to your normal activities. Do not do anything that stretches or puts pressure on your incision.  You can return to your normal diet.  Keep all follow-up visits as directed by your health care provider. This is important. Contact a health care provider if:  You have a fever.  Your incision bleeds.  You have redness, swelling, or pain in the incision area.  You  have fluid, blood, or pus coming from your incision.  Your cyst comes back after surgery. This information is not intended to replace advice given to you by your health care provider. Make sure you discuss any questions you have with your health care provider. Document Released: 06/18/2014 Document Revised: 11/03/2015 Document Reviewed: 02/10/2014 Elsevier Interactive Patient Education  2018 [employer] Inc[employer]  

## 2017-03-04 DIAGNOSIS — L814 Other melanin hyperpigmentation: Secondary | ICD-10-CM | POA: Diagnosis not present

## 2017-03-04 DIAGNOSIS — L918 Other hypertrophic disorders of the skin: Secondary | ICD-10-CM | POA: Diagnosis not present

## 2017-03-04 DIAGNOSIS — D1801 Hemangioma of skin and subcutaneous tissue: Secondary | ICD-10-CM | POA: Diagnosis not present

## 2017-03-04 DIAGNOSIS — L821 Other seborrheic keratosis: Secondary | ICD-10-CM | POA: Diagnosis not present

## 2017-03-27 DIAGNOSIS — Z23 Encounter for immunization: Secondary | ICD-10-CM | POA: Diagnosis not present

## 2017-05-01 DIAGNOSIS — Z79899 Other long term (current) drug therapy: Secondary | ICD-10-CM | POA: Diagnosis not present

## 2017-05-01 DIAGNOSIS — E559 Vitamin D deficiency, unspecified: Secondary | ICD-10-CM | POA: Diagnosis not present

## 2017-05-01 DIAGNOSIS — R7301 Impaired fasting glucose: Secondary | ICD-10-CM | POA: Diagnosis not present

## 2017-05-01 DIAGNOSIS — R82998 Other abnormal findings in urine: Secondary | ICD-10-CM | POA: Diagnosis not present

## 2017-05-01 DIAGNOSIS — E7800 Pure hypercholesterolemia, unspecified: Secondary | ICD-10-CM | POA: Diagnosis not present

## 2017-05-08 DIAGNOSIS — E559 Vitamin D deficiency, unspecified: Secondary | ICD-10-CM | POA: Diagnosis not present

## 2017-05-08 DIAGNOSIS — R7301 Impaired fasting glucose: Secondary | ICD-10-CM | POA: Diagnosis not present

## 2017-05-08 DIAGNOSIS — E7800 Pure hypercholesterolemia, unspecified: Secondary | ICD-10-CM | POA: Diagnosis not present

## 2017-05-08 DIAGNOSIS — Z0000 Encounter for general adult medical examination without abnormal findings: Secondary | ICD-10-CM | POA: Diagnosis not present

## 2017-05-08 DIAGNOSIS — Z6826 Body mass index (BMI) 26.0-26.9, adult: Secondary | ICD-10-CM | POA: Diagnosis not present

## 2017-05-08 DIAGNOSIS — M859 Disorder of bone density and structure, unspecified: Secondary | ICD-10-CM | POA: Diagnosis not present

## 2017-05-08 DIAGNOSIS — Z800 Family history of malignant neoplasm of digestive organs: Secondary | ICD-10-CM | POA: Diagnosis not present

## 2017-05-08 DIAGNOSIS — K648 Other hemorrhoids: Secondary | ICD-10-CM | POA: Diagnosis not present

## 2017-05-08 DIAGNOSIS — K219 Gastro-esophageal reflux disease without esophagitis: Secondary | ICD-10-CM | POA: Diagnosis not present

## 2017-05-08 DIAGNOSIS — Z1389 Encounter for screening for other disorder: Secondary | ICD-10-CM | POA: Diagnosis not present

## 2017-05-22 DIAGNOSIS — Z1212 Encounter for screening for malignant neoplasm of rectum: Secondary | ICD-10-CM | POA: Diagnosis not present

## 2017-05-23 DIAGNOSIS — K648 Other hemorrhoids: Secondary | ICD-10-CM | POA: Diagnosis not present

## 2017-05-31 ENCOUNTER — Ambulatory Visit: Payer: Medicare HMO | Admitting: Obstetrics & Gynecology

## 2017-05-31 ENCOUNTER — Encounter: Payer: Self-pay | Admitting: Obstetrics & Gynecology

## 2017-05-31 VITALS — BP 128/84

## 2017-05-31 DIAGNOSIS — R350 Frequency of micturition: Secondary | ICD-10-CM

## 2017-05-31 MED ORDER — SULFAMETHOXAZOLE-TRIMETHOPRIM 800-160 MG PO TABS: 1.0000 | ORAL_TABLET | Freq: Two times a day (BID) | ORAL | 0 refills | Status: AC

## 2017-05-31 NOTE — Progress Notes (Signed)
    Jamie Duncan Jan 07, 1941 [id number]        76 y.o.  G1P1001   RP:  Urinary frequency/dysuria x last night  HPI:  Menopause, no HRT.  Not sexually active.  No PMB.  Had a Hemorrhoidectomy last week.  C/O urinary frequency, pain when passing urine and pelvic pressure x last night.  No vaginal d/c.  No itching, no odor.  No recent UTI, but Sxs similar to when she had a UTI in the past.  No fever.  BMs wnl.  Past medical history,surgical history, problem list, medications, allergies, family history and social history were all reviewed and documented in the EPIC chart.  Directed ROS with pertinent positives and negatives documented in the history of present illness/assessment and plan.  Exam:  Vitals:   05/31/17 1233  BP: 128/84   General appearance:  Normal  CVAT negative bilaterally  Tender at suprapubic area   Assessment/Plan:  76 y.o. G1P1001   1. Frequency of urination - Urinalysis with Culture Reflex Mild perturbation of U/A.  Pending U. Culture.  Decision to treat with Bactrim.  Usage/risks/benefits.   Other orders - sulfamethoxazole-trimethoprim (BACTRIM DS,SEPTRA DS) 800-160 MG tablet; Take 1 tablet by mouth 2 (two) times daily for 3 days.  Counseling on above issues >50% x 15 minutes.  Princess Bruins MD, 12:42 PM 05/31/2017

## 2017-05-31 NOTE — Addendum Note (Signed)
Addended by: Thurnell Garbe A on: 05/31/2017 01:17 PM   Modules accepted: Orders

## 2017-05-31 NOTE — Patient Instructions (Signed)
1. Frequency of urination - Urinalysis with Culture Reflex Mild perturbation of U/A.  Pending U. Culture.  Decision to treat with Bactrim.  Usage/risks/benefits.   Other orders - sulfamethoxazole-trimethoprim (BACTRIM DS,SEPTRA DS) 800-160 MG tablet; Take 1 tablet by mouth 2 (two) times daily for 3 days.  Javionna, it was a pleasure meeting you today!   Urinary Tract Infection, Adult A urinary tract infection (UTI) is an infection of any part of the urinary tract, which includes the kidneys, ureters, bladder, and urethra. These organs make, store, and get rid of urine in the body. UTI can be a bladder infection (cystitis) or kidney infection (pyelonephritis). What are the causes? This infection may be caused by fungi, viruses, or bacteria. Bacteria are the most common cause of UTIs. This condition can also be caused by repeated incomplete emptying of the bladder during urination. What increases the risk? This condition is more likely to develop if:  You ignore your need to urinate or hold urine for long periods of time.  You do not empty your bladder completely during urination.  You wipe back to front after urinating or having a bowel movement, if you are female.  You are uncircumcised, if you are female.  You are constipated.  You have a urinary catheter that stays in place (indwelling).  You have a weak defense (immune) system.  You have a medical condition that affects your bowels, kidneys, or bladder.  You have diabetes.  You take antibiotic medicines frequently or for long periods of time, and the antibiotics no longer work well against certain types of infections (antibiotic resistance).  You take medicines that irritate your urinary tract.  You are exposed to chemicals that irritate your urinary tract.  You are female.  What are the signs or symptoms? Symptoms of this condition include:  Fever.  Frequent urination or passing small amounts of urine  frequently.  Needing to urinate urgently.  Pain or burning with urination.  Urine that smells bad or unusual.  Cloudy urine.  Pain in the lower abdomen or back.  Trouble urinating.  Blood in the urine.  Vomiting or being less hungry than normal.  Diarrhea or abdominal pain.  Vaginal discharge, if you are female.  How is this diagnosed? This condition is diagnosed with a medical history and physical exam. You will also need to provide a urine sample to test your urine. Other tests may be done, including:  Blood tests.  Sexually transmitted disease (STD) testing.  If you have had more than one UTI, a cystoscopy or imaging studies may be done to determine the cause of the infections. How is this treated? Treatment for this condition often includes a combination of two or more of the following:  Antibiotic medicine.  Other medicines to treat less common causes of UTI.  Over-the-counter medicines to treat pain.  Drinking enough water to stay hydrated.  Follow these instructions at home:  Take over-the-counter and prescription medicines only as told by your health care provider.  If you were prescribed an antibiotic, take it as told by your health care provider. Do not stop taking the antibiotic even if you start to feel better.  Avoid alcohol, caffeine, tea, and carbonated beverages. They can irritate your bladder.  Drink enough fluid to keep your urine clear or pale yellow.  Keep all follow-up visits as told by your health care provider. This is important.  Make sure to: ? Empty your bladder often and completely. Do not hold urine for long  periods of time. ? Empty your bladder before and after sex. ? Wipe from front to back after a bowel movement if you are female. Use each tissue one time when you wipe. Contact a health care provider if:  You have back pain.  You have a fever.  You feel nauseous or vomit.  Your symptoms do not get better after 3  days.  Your symptoms go away and then return. Get help right away if:  You have severe back pain or lower abdominal pain.  You are vomiting and cannot keep down any medicines or water. This information is not intended to replace advice given to you by your health care provider. Make sure you discuss any questions you have with your health care provider. Document Released: 03/07/2005 Document Revised: 11/09/2015 Document Reviewed: 04/18/2015 Elsevier Interactive Patient Education  [employer].

## 2017-06-01 LAB — URINE CULTURE
MICRO NUMBER:: 81439648
SPECIMEN QUALITY:: ADEQUATE

## 2017-06-01 LAB — URINALYSIS W MICROSCOPIC + REFLEX CULTURE
Bilirubin Urine: NEGATIVE
Glucose, UA: NEGATIVE
HYALINE CAST: NONE SEEN /LPF
KETONES UR: NEGATIVE
Leukocyte Esterase: NEGATIVE
Nitrites, Initial: NEGATIVE
PROTEIN: NEGATIVE
SPECIFIC GRAVITY, URINE: 1.01 (ref 1.001–1.03)
pH: 7 (ref 5.0–8.0)

## 2017-06-01 LAB — NO CULTURE INDICATED

## 2017-07-08 DIAGNOSIS — R69 Illness, unspecified: Secondary | ICD-10-CM | POA: Diagnosis not present

## 2017-08-07 DIAGNOSIS — R69 Illness, unspecified: Secondary | ICD-10-CM | POA: Diagnosis not present

## 2017-08-12 DIAGNOSIS — R69 Illness, unspecified: Secondary | ICD-10-CM | POA: Diagnosis not present

## 2017-08-21 DIAGNOSIS — R69 Illness, unspecified: Secondary | ICD-10-CM | POA: Diagnosis not present

## 2017-11-20 DIAGNOSIS — H43813 Vitreous degeneration, bilateral: Secondary | ICD-10-CM | POA: Diagnosis not present

## 2017-11-20 DIAGNOSIS — H25012 Cortical age-related cataract, left eye: Secondary | ICD-10-CM | POA: Diagnosis not present

## 2017-11-20 DIAGNOSIS — H5212 Myopia, left eye: Secondary | ICD-10-CM | POA: Diagnosis not present

## 2017-11-20 DIAGNOSIS — H2512 Age-related nuclear cataract, left eye: Secondary | ICD-10-CM | POA: Diagnosis not present

## 2018-02-04 ENCOUNTER — Other Ambulatory Visit: Payer: Self-pay | Admitting: Internal Medicine

## 2018-02-04 DIAGNOSIS — Z1231 Encounter for screening mammogram for malignant neoplasm of breast: Secondary | ICD-10-CM

## 2018-02-11 DIAGNOSIS — H2512 Age-related nuclear cataract, left eye: Secondary | ICD-10-CM | POA: Diagnosis not present

## 2018-02-11 DIAGNOSIS — H43812 Vitreous degeneration, left eye: Secondary | ICD-10-CM | POA: Diagnosis not present

## 2018-02-11 DIAGNOSIS — H25012 Cortical age-related cataract, left eye: Secondary | ICD-10-CM | POA: Diagnosis not present

## 2018-02-27 DIAGNOSIS — H25812 Combined forms of age-related cataract, left eye: Secondary | ICD-10-CM | POA: Diagnosis not present

## 2018-02-27 DIAGNOSIS — H2512 Age-related nuclear cataract, left eye: Secondary | ICD-10-CM | POA: Diagnosis not present

## 2018-02-27 DIAGNOSIS — H25012 Cortical age-related cataract, left eye: Secondary | ICD-10-CM | POA: Diagnosis not present

## 2018-03-06 ENCOUNTER — Ambulatory Visit
Admission: RE | Admit: 2018-03-06 | Discharge: 2018-03-06 | Disposition: A | Discharge status: Home / Self Care | Payer: Medicare HMO | Source: Ambulatory Visit | Attending: Internal Medicine | Admitting: Internal Medicine

## 2018-03-06 DIAGNOSIS — Z1231 Encounter for screening mammogram for malignant neoplasm of breast: Secondary | ICD-10-CM | POA: Diagnosis not present

## 2018-03-07 DIAGNOSIS — Z23 Encounter for immunization: Secondary | ICD-10-CM | POA: Diagnosis not present

## 2018-03-25 ENCOUNTER — Ambulatory Visit: Payer: Medicare HMO | Admitting: Women's Health

## 2018-03-25 ENCOUNTER — Encounter: Payer: Self-pay | Admitting: Women's Health

## 2018-03-25 VITALS — BP 114/70 | Ht 64.8 in | Wt 160.0 lb

## 2018-03-25 DIAGNOSIS — R69 Illness, unspecified: Secondary | ICD-10-CM | POA: Diagnosis not present

## 2018-03-25 DIAGNOSIS — Z01419 Encounter for gynecological examination (general) (routine) without abnormal findings: Secondary | ICD-10-CM | POA: Diagnosis not present

## 2018-03-25 DIAGNOSIS — Z7251 High risk heterosexual behavior: Secondary | ICD-10-CM

## 2018-03-25 DIAGNOSIS — Z779 Other contact with and (suspected) exposures hazardous to health: Secondary | ICD-10-CM | POA: Diagnosis not present

## 2018-03-25 NOTE — Progress Notes (Signed)
Jamie Duncan 1940-09-30 [id number]    History:    Presents for breast and pelvic exam with no complaints.  Postmenopausal on no HRT with no bleeding.  Primary care manages bone density reports does have some bone loss.  2013- colonoscopy.  Immunizations current.  Beginning to date has not been sexually active.  Hypercholesteremia managed by primary care.  Past medical history, past surgical history, family history and social history were all reviewed and documented in the EPIC chart.  Rescue dog yogi doing well.  One daughter with no grandchildren.  ROS:  A ROS was performed and pertinent positives and negatives are included.  Exam:  Vitals:   03/25/18 1420  BP: 114/70  Weight: 160 lb (72.6 kg)  Height: 5' 4.8" (1.646 m)   Body mass index is 26.79 kg/m.   General appearance:  Normal Thyroid:  Symmetrical, normal in size, without palpable masses or nodularity. Respiratory  Auscultation:  Clear without wheezing or rhonchi Cardiovascular  Auscultation:  Regular rate, without rubs, murmurs or gallops  Edema/varicosities:  Not grossly evident Abdominal  Soft,nontender, without masses, guarding or rebound.  Liver/spleen:  No organomegaly noted  Hernia:  None appreciated  Skin  Inspection:  Grossly normal   Breasts: Examined lying and sitting.     Right: Without masses, retractions, discharge or axillary adenopathy.     Left: Without masses, retractions, discharge or axillary adenopathy. Gentitourinary   Inguinal/mons:  Normal without inguinal adenopathy  External genitalia:  Normal  BUS/Urethra/Skene's glands:  Normal  Vagina: Atrophic  Cervix:  Normal  Uterus:  normal in size, shape and contour.  Midline and mobile  Adnexa/parametria:     Rt: Without masses or tenderness.   Lt: Without masses or tenderness.  Anus and perineum: Normal    Assessment/Plan:  77 y.o. WWF G1, P1 for breast  and pelvic exam with no complaints..  Post menopausal on no HRT with no bleeding  with vaginal atrophy Osteopenia primary care manages Hypercholesteremia primary care manages meds and labs  Plan: Has started dating, encouraged STD screen partner prior to intercourse and to use vaginal lubricants.  SBE's, continue annual 3D screening mammogram.  Reviewed importance of weightbearing and balance type exercise like yoga encouraged.    [name] [facility], 3:05 PM 03/25/2018

## 2018-03-25 NOTE — Patient Instructions (Signed)
Health Maintenance for Postmenopausal Women Menopause is a normal process in which your reproductive ability comes to an end. This process happens gradually over a span of months to years, usually between the ages of 36 and 64. Menopause is complete when you have missed 12 consecutive menstrual periods. It is important to talk with your health care provider about some of the most common conditions that affect postmenopausal women, such as heart disease, cancer, and bone loss (osteoporosis). Adopting a healthy lifestyle and getting preventive care can help to promote your health and wellness. Those actions can also lower your chances of developing some of these common conditions. What should I know about menopause? During menopause, you may experience a number of symptoms, such as:  Moderate-to-severe hot flashes.  Night sweats.  Decrease in sex drive.  Mood swings.  Headaches.  Tiredness.  Irritability.  Memory problems.  Insomnia.  Choosing to treat or not to treat menopausal changes is an individual decision that you make with your health care provider. What should I know about hormone replacement therapy and supplements? Hormone therapy products are effective for treating symptoms that are associated with menopause, such as hot flashes and night sweats. Hormone replacement carries certain risks, especially as you become older. If you are thinking about using estrogen or estrogen with progestin treatments, discuss the benefits and risks with your health care provider. What should I know about heart disease and stroke? Heart disease, heart attack, and stroke become more likely as you age. This may be due, in part, to the hormonal changes that your body experiences during menopause. These can affect how your body processes dietary fats, triglycerides, and cholesterol. Heart attack and stroke are both medical emergencies. There are many things that you can do to help prevent heart disease  and stroke:  Have your blood pressure checked at least every 1-2 years. High blood pressure causes heart disease and increases the risk of stroke.  If you are 13-19 years old, ask your health care provider if you should take aspirin to prevent a heart attack or a stroke.  Do not use any tobacco products, including cigarettes, chewing tobacco, or electronic cigarettes. If you need help quitting, ask your health care provider.  It is important to eat a healthy diet and maintain a healthy weight. ? Be sure to include plenty of vegetables, fruits, low-fat dairy products, and lean protein. ? Avoid eating foods that are high in solid fats, added sugars, or salt (sodium).  Get regular exercise. This is one of the most important things that you can do for your health. ? Try to exercise for at least 150 minutes each week. The type of exercise that you do should increase your heart rate and make you sweat. This is known as moderate-intensity exercise. ? Try to do strengthening exercises at least twice each week. Do these in addition to the moderate-intensity exercise.  Know your numbers.Ask your health care provider to check your cholesterol and your blood glucose. Continue to have your blood tested as directed by your health care provider.  What should I know about cancer screening? There are several types of cancer. Take the following steps to reduce your risk and to catch any cancer development as early as possible. Breast Cancer  Practice breast self-awareness. ? This means understanding how your breasts normally appear and feel. ? It also means doing regular breast self-exams. Let your health care provider know about any changes, no matter how small.  If you are 40  or older, have a clinician do a breast exam (clinical breast exam or CBE) every year. Depending on your age, family history, and medical history, it may be recommended that you also have a yearly breast X-ray (mammogram).  If you  have a family history of breast cancer, talk with your health care provider about genetic screening.  If you are at high risk for breast cancer, talk with your health care provider about having an MRI and a mammogram every year.  Breast cancer (BRCA) gene test is recommended for women who have family members with BRCA-related cancers. Results of the assessment will determine the need for genetic counseling and BRCA1 and for BRCA2 testing. BRCA-related cancers include these types: ? Breast. This occurs in males or females. ? Ovarian. ? Tubal. This may also be called fallopian tube cancer. ? Cancer of the abdominal or pelvic lining (peritoneal cancer). ? Prostate. ? Pancreatic.  Cervical, Uterine, and Ovarian Cancer Your health care provider may recommend that you be screened regularly for cancer of the pelvic organs. These include your ovaries, uterus, and vagina. This screening involves a pelvic exam, which includes checking for microscopic changes to the surface of your cervix (Pap test).  For women ages 21-65, health care providers may recommend a pelvic exam and a Pap test every three years. For women ages 79-65, they may recommend the Pap test and pelvic exam, combined with testing for human papilloma virus (HPV), every five years. Some types of HPV increase your risk of cervical cancer. Testing for HPV may also be done on women of any age who have unclear Pap test results.  Other health care providers may not recommend any screening for nonpregnant women who are considered low risk for pelvic cancer and have no symptoms. Ask your health care provider if a screening pelvic exam is right for you.  If you have had past treatment for cervical cancer or a condition that could lead to cancer, you need Pap tests and screening for cancer for at least 20 years after your treatment. If Pap tests have been discontinued for you, your risk factors (such as having a new sexual partner) need to be  reassessed to determine if you should start having screenings again. Some women have medical problems that increase the chance of getting cervical cancer. In these cases, your health care provider may recommend that you have screening and Pap tests more often.  If you have a family history of uterine cancer or ovarian cancer, talk with your health care provider about genetic screening.  If you have vaginal bleeding after reaching menopause, tell your health care provider.  There are currently no reliable tests available to screen for ovarian cancer.  Lung Cancer Lung cancer screening is recommended for adults 69-62 years old who are at high risk for lung cancer because of a history of smoking. A yearly low-dose CT scan of the lungs is recommended if you:  Currently smoke.  Have a history of at least 30 pack-years of smoking and you currently smoke or have quit within the past 15 years. A pack-year is smoking an average of one pack of cigarettes per day for one year.  Yearly screening should:  Continue until it has been 15 years since you quit.  Stop if you develop a health problem that would prevent you from having lung cancer treatment.  Colorectal Cancer  This type of cancer can be detected and can often be prevented.  Routine colorectal cancer screening usually begins at  age 30 and continues through age 88.  If you have risk factors for colon cancer, your health care provider may recommend that you be screened at an earlier age.  If you have a family history of colorectal cancer, talk with your health care provider about genetic screening.  Your health care provider may also recommend using home test kits to check for hidden blood in your stool.  A small camera at the end of a tube can be used to examine your colon directly (sigmoidoscopy or colonoscopy). This is done to check for the earliest forms of colorectal cancer.  Direct examination of the colon should be repeated every  5-10 years until age 17. However, if early forms of precancerous polyps or small growths are found or if you have a family history or genetic risk for colorectal cancer, you may need to be screened more often.  Skin Cancer  Check your skin from head to toe regularly.  Monitor any moles. Be sure to tell your health care provider: ? About any new moles or changes in moles, especially if there is a change in a mole's shape or color. ? If you have a mole that is larger than the size of a pencil eraser.  If any of your family members has a history of skin cancer, especially at a Cianni Manny age, talk with your health care provider about genetic screening.  Always use sunscreen. Apply sunscreen liberally and repeatedly throughout the day.  Whenever you are outside, protect yourself by wearing long sleeves, pants, a wide-brimmed hat, and sunglasses.  What should I know about osteoporosis? Osteoporosis is a condition in which bone destruction happens more quickly than new bone creation. After menopause, you may be at an increased risk for osteoporosis. To help prevent osteoporosis or the bone fractures that can happen because of osteoporosis, the following is recommended:  If you are 27-2 years old, get at least 1,000 mg of calcium and at least 600 mg of vitamin D per day.  If you are older than age 63 but younger than age 51, get at least 1,200 mg of calcium and at least 600 mg of vitamin D per day.  If you are older than age 15, get at least 1,200 mg of calcium and at least 800 mg of vitamin D per day.  Smoking and excessive alcohol intake increase the risk of osteoporosis. Eat foods that are rich in calcium and vitamin D, and do weight-bearing exercises several times each week as directed by your health care provider. What should I know about how menopause affects my mental health? Depression may occur at any age, but it is more common as you become older. Common symptoms of depression  include:  Low or sad mood.  Changes in sleep patterns.  Changes in appetite or eating patterns.  Feeling an overall lack of motivation or enjoyment of activities that you previously enjoyed.  Frequent crying spells.  Talk with your health care provider if you think that you are experiencing depression. What should I know about immunizations? It is important that you get and maintain your immunizations. These include:  Tetanus, diphtheria, and pertussis (Tdap) booster vaccine.  Influenza every year before the flu season begins.  Pneumonia vaccine.  Shingles vaccine.  Your health care provider may also recommend other immunizations. This information is not intended to replace advice given to you by your health care provider. Make sure you discuss any questions you have with your health care provider. Document Released: 07/20/2005  Document Revised: 12/16/2015 Document Reviewed: 03/01/2015 Elsevier Interactive Patient Education  [employer].

## 2018-04-04 DIAGNOSIS — D225 Melanocytic nevi of trunk: Secondary | ICD-10-CM | POA: Diagnosis not present

## 2018-04-04 DIAGNOSIS — L720 Epidermal cyst: Secondary | ICD-10-CM | POA: Diagnosis not present

## 2018-04-04 DIAGNOSIS — D1801 Hemangioma of skin and subcutaneous tissue: Secondary | ICD-10-CM | POA: Diagnosis not present

## 2018-04-04 DIAGNOSIS — L821 Other seborrheic keratosis: Secondary | ICD-10-CM | POA: Diagnosis not present

## 2018-04-04 DIAGNOSIS — L918 Other hypertrophic disorders of the skin: Secondary | ICD-10-CM | POA: Diagnosis not present

## 2018-04-04 DIAGNOSIS — L603 Nail dystrophy: Secondary | ICD-10-CM | POA: Diagnosis not present

## 2018-04-04 DIAGNOSIS — L814 Other melanin hyperpigmentation: Secondary | ICD-10-CM | POA: Diagnosis not present

## 2018-04-14 ENCOUNTER — Telehealth: Payer: Self-pay | Admitting: *Deleted

## 2018-04-14 DIAGNOSIS — R3129 Other microscopic hematuria: Secondary | ICD-10-CM | POA: Diagnosis not present

## 2018-04-14 DIAGNOSIS — R358 Other polyuria: Secondary | ICD-10-CM | POA: Diagnosis not present

## 2018-04-14 DIAGNOSIS — R350 Frequency of micturition: Secondary | ICD-10-CM | POA: Diagnosis not present

## 2018-04-14 NOTE — Telephone Encounter (Signed)
Patient called c/o urine frequency and irritation, took 1 Azo pill and no relief, wanted to leave u/a I explained OV best for same day treatment. Patient declined visit for today states" I was just there" I explained correct for annual exam. Patient states she will go to urgent care.

## 2018-05-06 DIAGNOSIS — E559 Vitamin D deficiency, unspecified: Secondary | ICD-10-CM | POA: Diagnosis not present

## 2018-05-06 DIAGNOSIS — R7301 Impaired fasting glucose: Secondary | ICD-10-CM | POA: Diagnosis not present

## 2018-05-06 DIAGNOSIS — R82998 Other abnormal findings in urine: Secondary | ICD-10-CM | POA: Diagnosis not present

## 2018-05-06 DIAGNOSIS — E7800 Pure hypercholesterolemia, unspecified: Secondary | ICD-10-CM | POA: Diagnosis not present

## 2018-05-13 DIAGNOSIS — M859 Disorder of bone density and structure, unspecified: Secondary | ICD-10-CM | POA: Diagnosis not present

## 2018-05-13 DIAGNOSIS — M65819 Other synovitis and tenosynovitis, unspecified shoulder: Secondary | ICD-10-CM | POA: Diagnosis not present

## 2018-05-13 DIAGNOSIS — R7301 Impaired fasting glucose: Secondary | ICD-10-CM | POA: Diagnosis not present

## 2018-05-13 DIAGNOSIS — Z1389 Encounter for screening for other disorder: Secondary | ICD-10-CM | POA: Diagnosis not present

## 2018-05-13 DIAGNOSIS — K219 Gastro-esophageal reflux disease without esophagitis: Secondary | ICD-10-CM | POA: Diagnosis not present

## 2018-05-13 DIAGNOSIS — Z0000 Encounter for general adult medical examination without abnormal findings: Secondary | ICD-10-CM | POA: Diagnosis not present

## 2018-05-13 DIAGNOSIS — Z6827 Body mass index (BMI) 27.0-27.9, adult: Secondary | ICD-10-CM | POA: Diagnosis not present

## 2018-05-13 DIAGNOSIS — Z800 Family history of malignant neoplasm of digestive organs: Secondary | ICD-10-CM | POA: Diagnosis not present

## 2018-05-13 DIAGNOSIS — E559 Vitamin D deficiency, unspecified: Secondary | ICD-10-CM | POA: Diagnosis not present

## 2018-05-13 DIAGNOSIS — E7800 Pure hypercholesterolemia, unspecified: Secondary | ICD-10-CM | POA: Diagnosis not present

## 2018-05-15 DIAGNOSIS — Z1212 Encounter for screening for malignant neoplasm of rectum: Secondary | ICD-10-CM | POA: Diagnosis not present

## 2018-05-20 DIAGNOSIS — M859 Disorder of bone density and structure, unspecified: Secondary | ICD-10-CM | POA: Diagnosis not present

## 2018-06-13 DIAGNOSIS — H353221 Exudative age-related macular degeneration, left eye, with active choroidal neovascularization: Secondary | ICD-10-CM | POA: Diagnosis not present

## 2018-06-16 DIAGNOSIS — H43811 Vitreous degeneration, right eye: Secondary | ICD-10-CM | POA: Diagnosis not present

## 2018-06-16 DIAGNOSIS — H35371 Puckering of macula, right eye: Secondary | ICD-10-CM | POA: Diagnosis not present

## 2018-06-16 DIAGNOSIS — H353132 Nonexudative age-related macular degeneration, bilateral, intermediate dry stage: Secondary | ICD-10-CM | POA: Diagnosis not present

## 2018-06-16 DIAGNOSIS — H353221 Exudative age-related macular degeneration, left eye, with active choroidal neovascularization: Secondary | ICD-10-CM | POA: Diagnosis not present

## 2018-06-19 DIAGNOSIS — H353221 Exudative age-related macular degeneration, left eye, with active choroidal neovascularization: Secondary | ICD-10-CM | POA: Diagnosis not present

## 2018-07-09 DIAGNOSIS — R69 Illness, unspecified: Secondary | ICD-10-CM | POA: Diagnosis not present

## 2018-07-11 DIAGNOSIS — N390 Urinary tract infection, site not specified: Secondary | ICD-10-CM | POA: Diagnosis not present

## 2018-07-24 DIAGNOSIS — H353132 Nonexudative age-related macular degeneration, bilateral, intermediate dry stage: Secondary | ICD-10-CM | POA: Diagnosis not present

## 2018-07-24 DIAGNOSIS — H353221 Exudative age-related macular degeneration, left eye, with active choroidal neovascularization: Secondary | ICD-10-CM | POA: Diagnosis not present

## 2018-08-13 DIAGNOSIS — N390 Urinary tract infection, site not specified: Secondary | ICD-10-CM | POA: Diagnosis not present

## 2018-08-13 DIAGNOSIS — R300 Dysuria: Secondary | ICD-10-CM | POA: Diagnosis not present

## 2018-08-18 DIAGNOSIS — Z6827 Body mass index (BMI) 27.0-27.9, adult: Secondary | ICD-10-CM | POA: Diagnosis not present

## 2018-08-18 DIAGNOSIS — R300 Dysuria: Secondary | ICD-10-CM | POA: Diagnosis not present

## 2018-08-28 DIAGNOSIS — H353221 Exudative age-related macular degeneration, left eye, with active choroidal neovascularization: Secondary | ICD-10-CM | POA: Diagnosis not present

## 2018-08-28 DIAGNOSIS — H353132 Nonexudative age-related macular degeneration, bilateral, intermediate dry stage: Secondary | ICD-10-CM | POA: Diagnosis not present

## 2018-10-09 DIAGNOSIS — H353221 Exudative age-related macular degeneration, left eye, with active choroidal neovascularization: Secondary | ICD-10-CM | POA: Diagnosis not present

## 2018-10-09 DIAGNOSIS — H353132 Nonexudative age-related macular degeneration, bilateral, intermediate dry stage: Secondary | ICD-10-CM | POA: Diagnosis not present

## 2018-11-27 DIAGNOSIS — H353221 Exudative age-related macular degeneration, left eye, with active choroidal neovascularization: Secondary | ICD-10-CM | POA: Diagnosis not present

## 2018-12-17 DIAGNOSIS — H26492 Other secondary cataract, left eye: Secondary | ICD-10-CM | POA: Diagnosis not present

## 2018-12-17 DIAGNOSIS — H52203 Unspecified astigmatism, bilateral: Secondary | ICD-10-CM | POA: Diagnosis not present

## 2018-12-17 DIAGNOSIS — H04123 Dry eye syndrome of bilateral lacrimal glands: Secondary | ICD-10-CM | POA: Diagnosis not present

## 2018-12-17 DIAGNOSIS — H353221 Exudative age-related macular degeneration, left eye, with active choroidal neovascularization: Secondary | ICD-10-CM | POA: Diagnosis not present

## 2019-01-22 DIAGNOSIS — H353221 Exudative age-related macular degeneration, left eye, with active choroidal neovascularization: Secondary | ICD-10-CM | POA: Diagnosis not present

## 2019-01-22 DIAGNOSIS — H353132 Nonexudative age-related macular degeneration, bilateral, intermediate dry stage: Secondary | ICD-10-CM | POA: Diagnosis not present

## 2019-01-28 DIAGNOSIS — R69 Illness, unspecified: Secondary | ICD-10-CM | POA: Diagnosis not present

## 2019-02-21 DIAGNOSIS — Z23 Encounter for immunization: Secondary | ICD-10-CM | POA: Diagnosis not present

## 2019-03-19 DIAGNOSIS — Z961 Presence of intraocular lens: Secondary | ICD-10-CM | POA: Diagnosis not present

## 2019-03-19 DIAGNOSIS — H353132 Nonexudative age-related macular degeneration, bilateral, intermediate dry stage: Secondary | ICD-10-CM | POA: Diagnosis not present

## 2019-03-19 DIAGNOSIS — H353221 Exudative age-related macular degeneration, left eye, with active choroidal neovascularization: Secondary | ICD-10-CM | POA: Diagnosis not present

## 2019-03-19 DIAGNOSIS — H35371 Puckering of macula, right eye: Secondary | ICD-10-CM | POA: Diagnosis not present

## 2019-03-31 ENCOUNTER — Other Ambulatory Visit: Payer: Self-pay

## 2019-04-01 ENCOUNTER — Ambulatory Visit: Payer: Medicare HMO | Admitting: Women's Health

## 2019-04-01 ENCOUNTER — Other Ambulatory Visit: Payer: Self-pay

## 2019-04-01 ENCOUNTER — Encounter: Payer: Self-pay | Admitting: Women's Health

## 2019-04-01 VITALS — BP 110/80 | Ht 64.0 in | Wt 161.0 lb

## 2019-04-01 DIAGNOSIS — Z01419 Encounter for gynecological examination (general) (routine) without abnormal findings: Secondary | ICD-10-CM | POA: Diagnosis not present

## 2019-04-01 DIAGNOSIS — Z9189 Other specified personal risk factors, not elsewhere classified: Secondary | ICD-10-CM

## 2019-04-01 DIAGNOSIS — Z01411 Encounter for gynecological examination (general) (routine) with abnormal findings: Secondary | ICD-10-CM

## 2019-04-01 NOTE — Progress Notes (Addendum)
AQUA UNTHANK 01-03-1941 [id number]    History:    Presents for breast and pelvic exam.  Postmenopausal no HRT with no bleeding.  Normal Pap and mammogram history.  2013 - colonoscopy.  DEXA primary care reports mild osteopenia at right hip.  Hypercholesteremia and GERD primary care manages.  Not sexually active in years.  Vaccines current.  Past medical history, past surgical history, family history and social history were all reviewed and documented in the EPIC chart.  1 daughter, no grandchildren.  Rescue dog Yogi.   ROS:  A ROS was performed and pertinent positives and negatives are included.  Exam:  Vitals:   04/01/19 1408  BP: 110/80  Weight: 161 lb (73 kg)  Height: 5\' 4"  (1.626 m)   Body mass index is 27.64 kg/m.   General appearance:  Normal Thyroid:  Symmetrical, normal in size, without palpable masses or nodularity. Respiratory  Auscultation:  Clear without wheezing or rhonchi Cardiovascular  Auscultation:  Regular rate, without rubs, murmurs or gallops  Edema/varicosities:  Not grossly evident Abdominal  Soft,nontender, without masses, guarding or rebound.  Liver/spleen:  No organomegaly noted  Hernia:  None appreciated  Skin  Inspection:  Grossly normal   Breasts: Examined lying and sitting.     Right: Without masses, retractions, discharge or axillary adenopathy.     Left: Without masses, retractions, discharge or axillary adenopathy. Gentitourinary   Inguinal/mons:  Normal without inguinal adenopathy  External genitalia:  Normal  BUS/Urethra/Skene's glands:  Normal  Vagina:  Atrophic             Cervix:  Normal  Uterus:  normal in size, shape and contour.  Midline and mobile  Adnexa/parametria:     Rt: Without masses or tenderness.   Lt: Without masses or tenderness.  Anus and perineum: Normal  Digital rectal exam: Normal sphincter tone without palpated masses or tenderness  Assessment/Plan:  78 y.o. S WF G1 P1 for breast and pelvic exam with no  complaints.  Postmenopausal/no HRT/no bleeding Asymptomatic vaginal atrophy/not sexually active Hypercholesteremia-primary care manages labs and meds Mild osteopenia right hip-primary care  Plan: SBEs, annual screening mammogram, due instructed to schedule.  Continue healthy lifestyle of regular daily walking, balance type exercise encouraged such as yoga, calcium rich foods, continue vitamin D supplement.  Home safety, fall prevention discussed.  Pap screening guidelines reviewed.    [name] [facility], 2:19 PM 04/01/2019

## 2019-04-01 NOTE — Patient Instructions (Signed)
Health Maintenance After Age 79 After age 79, you are at a higher risk for certain long-term diseases and infections as well as injuries from falls. Falls are a major cause of broken bones and head injuries in people who are older than age 79. Getting regular preventive care can help to keep you healthy and well. Preventive care includes getting regular testing and making lifestyle changes as recommended by your health care provider. Talk with your health care provider about:  Which screenings and tests you should have. A screening is a test that checks for a disease when you have no symptoms.  A diet and exercise plan that is right for you. What should I know about screenings and tests to prevent falls? Screening and testing are the best ways to find a health problem early. Early diagnosis and treatment give you the best chance of managing medical conditions that are common after age 79. Certain conditions and lifestyle choices may make you more likely to have a fall. Your health care provider may recommend:  Regular vision checks. Poor vision and conditions such as cataracts can make you more likely to have a fall. If you wear glasses, make sure to get your prescription updated if your vision changes.  Medicine review. Work with your health care provider to regularly review all of the medicines you are taking, including over-the-counter medicines. Ask your health care provider about any side effects that may make you more likely to have a fall. Tell your health care provider if any medicines that you take make you feel dizzy or sleepy.  Osteoporosis screening. Osteoporosis is a condition that causes the bones to get weaker. This can make the bones weak and cause them to break more easily.  Blood pressure screening. Blood pressure changes and medicines to control blood pressure can make you feel dizzy.  Strength and balance checks. Your health care provider may recommend certain tests to check your  strength and balance while standing, walking, or changing positions.  Foot health exam. Foot pain and numbness, as well as not wearing proper footwear, can make you more likely to have a fall.  Depression screening. You may be more likely to have a fall if you have a fear of falling, feel emotionally low, or feel unable to do activities that you used to do.  Alcohol use screening. Using too much alcohol can affect your balance and may make you more likely to have a fall. What actions can I take to lower my risk of falls? General instructions  Talk with your health care provider about your risks for falling. Tell your health care provider if: ? You fall. Be sure to tell your health care provider about all falls, even ones that seem minor. ? You feel dizzy, sleepy, or off-balance.  Take over-the-counter and prescription medicines only as told by your health care provider. These include any supplements.  Eat a healthy diet and maintain a healthy weight. A healthy diet includes low-fat dairy products, low-fat (lean) meats, and fiber from whole grains, beans, and lots of fruits and vegetables. Home safety  Remove any tripping hazards, such as rugs, cords, and clutter.  Install safety equipment such as grab bars in bathrooms and safety rails on stairs.  Keep rooms and walkways well-lit. Activity   Follow a regular exercise program to stay fit. This will help you maintain your balance. Ask your health care provider what types of exercise are appropriate for you.  If you need a cane or   walker, use it as recommended by your health care provider.  Wear supportive shoes that have nonskid soles. Lifestyle  Do not drink alcohol if your health care provider tells you not to drink.  If you drink alcohol, limit how much you have: ? 0-1 drink a day for women. ? 0-2 drinks a day for men.  Be aware of how much alcohol is in your drink. In the [place] one drink equals one typical bottle of beer (12  oz), one-half glass of wine (5 oz), or one shot of hard liquor (1 oz).  Do not use any products that contain nicotine or tobacco, such as cigarettes and e-cigarettes. If you need help quitting, ask your health care provider. Summary  Having a healthy lifestyle and getting preventive care can help to protect your health and wellness after age 79.  Screening and testing are the best way to find a health problem early and help you avoid having a fall. Early diagnosis and treatment give you the best chance for managing medical conditions that are more common for people who are older than age 79.  Falls are a major cause of broken bones and head injuries in people who are older than age 79. Take precautions to prevent a fall at home.  Work with your health care provider to learn what changes you can make to improve your health and wellness and to prevent falls. This information is not intended to replace advice given to you by your health care provider. Make sure you discuss any questions you have with your health care provider. Document Released: 04/10/2017 Document Revised: 09/18/2018 Document Reviewed: 04/10/2017 Elsevier Patient Education  2020 Elsevier Inc.  

## 2019-04-01 NOTE — Addendum Note (Signed)
Addended by: [name] on: 04/01/2019 03:04 PM   Modules accepted: Orders

## 2019-04-02 LAB — URINALYSIS, COMPLETE W/RFL CULTURE
Bacteria, UA: NONE SEEN /HPF
Bilirubin Urine: NEGATIVE
Glucose, UA: NEGATIVE
Hgb urine dipstick: NEGATIVE
Hyaline Cast: NONE SEEN /LPF
Ketones, ur: NEGATIVE
Leukocyte Esterase: NEGATIVE
Nitrites, Initial: NEGATIVE
Protein, ur: NEGATIVE
RBC / HPF: NONE SEEN /HPF (ref 0–2)
Specific Gravity, Urine: 1.014 (ref 1.001–1.03)
Squamous Epithelial / LPF: NONE SEEN /HPF (ref <=5)
WBC, UA: NONE SEEN /HPF (ref 0–5)
pH: 8 (ref 5.0–8.0)

## 2019-04-02 LAB — NO CULTURE INDICATED

## 2019-04-28 ENCOUNTER — Other Ambulatory Visit: Payer: Self-pay | Admitting: Internal Medicine

## 2019-04-28 DIAGNOSIS — Z1231 Encounter for screening mammogram for malignant neoplasm of breast: Secondary | ICD-10-CM

## 2019-05-04 DIAGNOSIS — H353221 Exudative age-related macular degeneration, left eye, with active choroidal neovascularization: Secondary | ICD-10-CM | POA: Diagnosis not present

## 2019-05-04 DIAGNOSIS — H353132 Nonexudative age-related macular degeneration, bilateral, intermediate dry stage: Secondary | ICD-10-CM | POA: Diagnosis not present

## 2019-05-13 DIAGNOSIS — E7800 Pure hypercholesterolemia, unspecified: Secondary | ICD-10-CM | POA: Diagnosis not present

## 2019-05-13 DIAGNOSIS — R7301 Impaired fasting glucose: Secondary | ICD-10-CM | POA: Diagnosis not present

## 2019-05-13 DIAGNOSIS — M859 Disorder of bone density and structure, unspecified: Secondary | ICD-10-CM | POA: Diagnosis not present

## 2019-05-15 DIAGNOSIS — R82998 Other abnormal findings in urine: Secondary | ICD-10-CM | POA: Diagnosis not present

## 2019-05-20 DIAGNOSIS — Z800 Family history of malignant neoplasm of digestive organs: Secondary | ICD-10-CM | POA: Diagnosis not present

## 2019-05-20 DIAGNOSIS — Z0000 Encounter for general adult medical examination without abnormal findings: Secondary | ICD-10-CM | POA: Diagnosis not present

## 2019-05-20 DIAGNOSIS — R7301 Impaired fasting glucose: Secondary | ICD-10-CM | POA: Diagnosis not present

## 2019-05-20 DIAGNOSIS — E7800 Pure hypercholesterolemia, unspecified: Secondary | ICD-10-CM | POA: Diagnosis not present

## 2019-05-20 DIAGNOSIS — E559 Vitamin D deficiency, unspecified: Secondary | ICD-10-CM | POA: Diagnosis not present

## 2019-05-20 DIAGNOSIS — Z1339 Encounter for screening examination for other mental health and behavioral disorders: Secondary | ICD-10-CM | POA: Diagnosis not present

## 2019-05-20 DIAGNOSIS — M545 Low back pain: Secondary | ICD-10-CM | POA: Diagnosis not present

## 2019-05-20 DIAGNOSIS — K219 Gastro-esophageal reflux disease without esophagitis: Secondary | ICD-10-CM | POA: Diagnosis not present

## 2019-05-20 DIAGNOSIS — M8580 Other specified disorders of bone density and structure, unspecified site: Secondary | ICD-10-CM | POA: Diagnosis not present

## 2019-05-27 DIAGNOSIS — Z1212 Encounter for screening for malignant neoplasm of rectum: Secondary | ICD-10-CM | POA: Diagnosis not present

## 2019-06-13 DIAGNOSIS — Z1212 Encounter for screening for malignant neoplasm of rectum: Secondary | ICD-10-CM | POA: Diagnosis not present

## 2019-06-13 DIAGNOSIS — Z1211 Encounter for screening for malignant neoplasm of colon: Secondary | ICD-10-CM | POA: Diagnosis not present

## 2019-06-22 DIAGNOSIS — D225 Melanocytic nevi of trunk: Secondary | ICD-10-CM | POA: Diagnosis not present

## 2019-06-22 DIAGNOSIS — L820 Inflamed seborrheic keratosis: Secondary | ICD-10-CM | POA: Diagnosis not present

## 2019-06-22 DIAGNOSIS — L821 Other seborrheic keratosis: Secondary | ICD-10-CM | POA: Diagnosis not present

## 2019-06-22 DIAGNOSIS — L298 Other pruritus: Secondary | ICD-10-CM | POA: Diagnosis not present

## 2019-06-22 DIAGNOSIS — L918 Other hypertrophic disorders of the skin: Secondary | ICD-10-CM | POA: Diagnosis not present

## 2019-06-22 DIAGNOSIS — L814 Other melanin hyperpigmentation: Secondary | ICD-10-CM | POA: Diagnosis not present

## 2019-06-25 ENCOUNTER — Other Ambulatory Visit: Payer: Self-pay

## 2019-06-25 ENCOUNTER — Ambulatory Visit
Admission: RE | Admit: 2019-06-25 | Discharge: 2019-06-25 | Disposition: A | Discharge status: Home / Self Care | Payer: Medicare HMO | Source: Ambulatory Visit | Attending: Internal Medicine | Admitting: Internal Medicine

## 2019-06-25 DIAGNOSIS — Z1231 Encounter for screening mammogram for malignant neoplasm of breast: Secondary | ICD-10-CM | POA: Diagnosis not present

## 2019-06-29 DIAGNOSIS — H353132 Nonexudative age-related macular degeneration, bilateral, intermediate dry stage: Secondary | ICD-10-CM | POA: Diagnosis not present

## 2019-06-29 DIAGNOSIS — H35371 Puckering of macula, right eye: Secondary | ICD-10-CM | POA: Diagnosis not present

## 2019-06-29 DIAGNOSIS — H35361 Drusen (degenerative) of macula, right eye: Secondary | ICD-10-CM | POA: Diagnosis not present

## 2019-06-29 DIAGNOSIS — H353221 Exudative age-related macular degeneration, left eye, with active choroidal neovascularization: Secondary | ICD-10-CM | POA: Diagnosis not present

## 2019-07-26 ENCOUNTER — Ambulatory Visit: Payer: Medicare HMO | Attending: Internal Medicine

## 2019-07-26 DIAGNOSIS — Z23 Encounter for immunization: Secondary | ICD-10-CM

## 2019-07-26 NOTE — Progress Notes (Signed)
   Covid-19 Vaccination Clinic  Name:  Jamie Duncan    MRN: [id number] DOB: 07-29-1940  07/26/2019  [name] was observed post Covid-19 immunization for 15 minutes without incidence. She was provided with Vaccine Information Sheet and instruction to access the V-Safe system.   [name] was instructed to call 911 with any severe reactions post vaccine: Marland Kitchen Difficulty breathing  . Swelling of your face and throat  . A fast heartbeat  . A bad rash all over your body  . Dizziness and weakness    Immunizations Administered    Name Date Dose VIS Date Route   Pfizer COVID-19 Vaccine 07/26/2019  2:52 PM 0.3 mL 05/22/2019 Intramuscular   Manufacturer: [place]   Lot: [id number]   [place]: [id number]

## 2019-08-05 DIAGNOSIS — R69 Illness, unspecified: Secondary | ICD-10-CM | POA: Diagnosis not present

## 2019-08-18 ENCOUNTER — Ambulatory Visit: Payer: Medicare HMO | Attending: Internal Medicine

## 2019-08-18 DIAGNOSIS — Z23 Encounter for immunization: Secondary | ICD-10-CM

## 2019-08-18 NOTE — Progress Notes (Signed)
   Covid-19 Vaccination Clinic  Name:  Jamie Duncan    MRN: [id number] DOB: 08/23/1940  08/18/2019  Jamie Duncan was observed post Covid-19 immunization for 15 minutes without incident. She was provided with Vaccine Information Sheet and instruction to access the V-Safe system.   Jamie Duncan was instructed to call 911 with any severe reactions post vaccine: Marland Kitchen Difficulty breathing  . Swelling of face and throat  . A fast heartbeat  . A bad rash all over body  . Dizziness and weakness   Immunizations Administered    Name Date Dose VIS Date Route   Pfizer COVID-19 Vaccine 08/18/2019  1:14 PM 0.3 mL 05/22/2019 Intramuscular   Manufacturer: [place]   Lot: [id number]   [place]: [id number]

## 2019-08-19 ENCOUNTER — Ambulatory Visit: Payer: Medicare HMO

## 2019-09-07 DIAGNOSIS — Z9889 Other specified postprocedural states: Secondary | ICD-10-CM | POA: Diagnosis not present

## 2019-09-07 DIAGNOSIS — H353221 Exudative age-related macular degeneration, left eye, with active choroidal neovascularization: Secondary | ICD-10-CM | POA: Diagnosis not present

## 2019-09-07 DIAGNOSIS — H35371 Puckering of macula, right eye: Secondary | ICD-10-CM | POA: Diagnosis not present

## 2019-09-07 DIAGNOSIS — H353132 Nonexudative age-related macular degeneration, bilateral, intermediate dry stage: Secondary | ICD-10-CM | POA: Diagnosis not present

## 2019-12-08 ENCOUNTER — Ambulatory Visit (INDEPENDENT_AMBULATORY_CARE_PROVIDER_SITE_OTHER): Payer: Medicare HMO | Admitting: Ophthalmology

## 2019-12-08 ENCOUNTER — Other Ambulatory Visit: Payer: Self-pay

## 2019-12-08 ENCOUNTER — Encounter (INDEPENDENT_AMBULATORY_CARE_PROVIDER_SITE_OTHER): Payer: Self-pay | Admitting: Ophthalmology

## 2019-12-08 DIAGNOSIS — H353132 Nonexudative age-related macular degeneration, bilateral, intermediate dry stage: Secondary | ICD-10-CM

## 2019-12-08 DIAGNOSIS — H353222 Exudative age-related macular degeneration, left eye, with inactive choroidal neovascularization: Secondary | ICD-10-CM | POA: Insufficient documentation

## 2019-12-08 DIAGNOSIS — H353221 Exudative age-related macular degeneration, left eye, with active choroidal neovascularization: Secondary | ICD-10-CM

## 2019-12-08 DIAGNOSIS — H35371 Puckering of macula, right eye: Secondary | ICD-10-CM | POA: Insufficient documentation

## 2019-12-08 MED ORDER — BEVACIZUMAB CHEMO INJECTION 1.25MG/0.05ML SYRINGE FOR KALEIDOSCOPE
1.2500 mg | INTRAVITREAL | Status: AC | PRN
  Administered 2019-12-08: 1.25 mg via INTRAVITREAL

## 2019-12-08 NOTE — Assessment & Plan Note (Signed)
OS, much less subretinal fluid as compared to the recent flareup of CN VM January 2020.  Currently at 33-month exam interval.  We will repeat intravitreal Avastin OS today to maintain quiescent  disease and then follow-up examination in 4 months

## 2019-12-08 NOTE — Progress Notes (Signed)
12/08/2019     CHIEF COMPLAINT Patient presents for Retina Follow Up   HISTORY OF PRESENT ILLNESS: Jamie Duncan is a 79 y.o. female who presents to the clinic today for:   HPI    Retina Follow Up    Patient presents with  Wet AMD.  In both eyes.  Severity is moderate.  Duration of 3 months.  Since onset it is stable.  I, the attending physician,  performed the HPI with the patient and updated documentation appropriately.          Comments    3 Month AMD f\u OU. Possible Avastin OS. OCT  Pt states she is having more trouble focusing. Pt states she avoids driving at night.       Last edited by Tilda Franco on 12/08/2019 10:49 AM. (History)      Referring physician: Haywood Pao, MD [place],  [place] [id number]  HISTORICAL INFORMATION:   Selected notes from the [facility]: Current Outpatient Medications (Ophthalmic Drugs)  Medication Sig  . moxifloxacin (VIGAMOX) 0.5 % ophthalmic solution PLACE 1 DROP 4 TIMES A DAY STARTING 2 DAYS PRIOR TO SURGERY & 2 DROP AM OF SURGERY  . prednisoLONE acetate (PRED FORTE) 1 % ophthalmic suspension PLACE 1 DROP TO OPERATIVE EYE 4 TIMES A DAY INTO LEFT EYE   No current facility-administered medications for this visit. (Ophthalmic Drugs)   Current Outpatient Medications (Other)  Medication Sig  . Ascorbic Acid (VITAMIN C PO) Take by mouth.    . Calcium Carbonate-Vit D-Min (CALTRATE PLUS PO) Take by mouth.    . fish oil-omega-3 fatty acids 1000 MG capsule Take 1 g by mouth daily.    . multivitamin (THERAGRAN) per tablet Take 1 tablet by mouth daily.    Marland Kitchen omeprazole (PRILOSEC) 20 MG capsule Take 20 mg by mouth daily.  . Psyllium (METAMUCIL PO) Take by mouth daily.  . Simvastatin (ZOCOR PO) Take 20 mg by mouth daily.    No current facility-administered medications for this visit. (Other)      REVIEW OF SYSTEMS:    ALLERGIES No Known Allergies  PAST MEDICAL  HISTORY Past Medical History:  Diagnosis Date  . Cataract   . GERD (gastroesophageal reflux disease)   . Hypercholesteremia    Past Surgical History:  Procedure Laterality Date  . CATARACT EXTRACTION  06/2016  . COLONOSCOPY  multiple  . EYE SURGERY    . TONSILLECTOMY    . WISDOM TOOTH EXTRACTION      FAMILY HISTORY Family History  Problem Relation Age of Onset  . Colon cancer Mother 52  . Diabetes Mother   . Diabetes Father   . Hypertension Father   . Esophageal cancer Maternal Uncle   . Esophageal cancer Cousin        Maternal cousin  . Stomach cancer Neg Hx   . Rectal cancer Neg Hx     SOCIAL HISTORY Social History   Tobacco Use  . Smoking status: Former Research scientist (life sciences)  . Smokeless tobacco: Never Used  Vaping Use  . Vaping Use: Never used  Substance Use Topics  . Alcohol use: Yes    Alcohol/week: 2.0 standard drinks    Types: 2 Standard drinks or equivalent per week    Comment: wine, cocktrail socially  . Drug use: No         OPHTHALMIC EXAM:  Base Eye Exam    Visual Acuity (Snellen -  Linear)      Right Left   Dist cc 20/30 -2 20/25       Tonometry (Tonopen, 10:53 AM)      Right Left   Pressure 21 16       Pupils      Pupils Dark Light Shape React APD   Right PERRL 4 3 Round Brisk None   Left PERRL 4 3 Round Brisk None       Visual Fields (Counting fingers)      Left Right    Full Full       Neuro/Psych    Oriented x3: Yes   Mood/Affect: Normal       Dilation    Both eyes: 1.0% Mydriacyl, 2.5% Phenylephrine @ 10:53 AM        Slit Lamp and Fundus Exam    External Exam      Right Left   External Normal Normal       Slit Lamp Exam      Right Left   Lids/Lashes Normal Normal   Conjunctiva/Sclera White and quiet White and quiet   Cornea Clear Clear   Anterior Chamber Deep and quiet Deep and quiet   Iris Round and reactive Round and reactive   Lens Centered posterior chamber intraocular lens Centered posterior chamber intraocular  lens   Anterior Vitreous Normal Normal       Fundus Exam      Right Left   Posterior Vitreous Posterior vitreous detachment Normal   Disc Normal Normal   C/D Ratio 0.5 0.45   Macula Hard drusen, no membrane, no macular thickening, no hemorrhage Retinal pigment epithelial mottling, Hard drusen, no exudates, no hemorrhage   Vessels Normal Normal   Periphery Normal Normal          IMAGING AND PROCEDURES  Imaging and Procedures for 12/08/19  OCT, Retina - OU - Both Eyes       Right Eye Quality was good. Scan locations included subfoveal. Central Foveal Thickness: 338. Progression has been stable. Findings include retinal drusen , no SRF, no IRF.   Left Eye Quality was good. Scan locations included subfoveal. Central Foveal Thickness: 257. Progression has been stable. Findings include no SRF, retinal drusen .   Notes OS, much less subretinal fluid as compared to the recent flareup of CN VM January 2020.  Currently at 20-month exam interval.  We will repeat intravitreal Avastin OS today to maintain quiesced since her disease and then follow-up examination in 4 months       Intravitreal Injection, Pharmacologic Agent - OS - Left Eye       Time Out 12/08/2019. 12:28 PM. Confirmed correct patient, procedure, site, and patient consented.   Anesthesia Topical anesthesia was used. Anesthetic medications included Akten 3.5%.   Procedure Preparation included Ofloxacin , 10% betadine to eyelids, 5% betadine to ocular surface. A 30 gauge needle was used.   Injection:  1.25 mg Bevacizumab (AVASTIN) SOLN   NDC: [address]-7, Lot: [id number]   Route: Intravitreal, Site: Left Eye, Waste: 0 mg  Post-op Post injection exam found visual acuity of at least counting fingers. The patient tolerated the procedure well. There were no complications. The patient received written and verbal post procedure care education. Post injection medications were not given.                   ASSESSMENT/PLAN:  Exudative age-related macular degeneration of left eye with active choroidal neovascularization (HCC) OS, much less subretinal fluid as  compared to the recent flareup of CN VM January 2020.  Currently at 64-month exam interval.  We will repeat intravitreal Avastin OS today to maintain quiescent  disease and then follow-up examination in 4 months        ICD-10-CM   1. Exudative age-related macular degeneration of left eye with active choroidal neovascularization (HCC)  H35.3221 OCT, Retina - OU - Both Eyes    Intravitreal Injection, Pharmacologic Agent - OS - Left Eye    Bevacizumab (AVASTIN) SOLN 1.25 mg  2. Intermediate stage nonexudative age-related macular degeneration of both eyes  H35.3132 OCT, Retina - OU - Both Eyes  3. Right epiretinal membrane  H35.371     1.OS, much less subretinal fluid as compared to the recent flareup of CN VM January 2020.  Currently at 29-month exam interval.  We will repeat intravitreal Avastin OS today to maintain quiescent disease and then follow-up examination in 4 months  2.  3.  Ophthalmic Meds Ordered this visit:  Meds ordered this encounter  Medications  . Bevacizumab (AVASTIN) SOLN 1.25 mg       Return in about 4 months (around 04/08/2020) for DILATE OU, AVASTIN OCT, OS.  There are no Patient Instructions on file for this visit.   Explained the diagnoses, plan, and follow up with the patient and they expressed understanding.  Patient expressed understanding of the importance of proper follow up care.   Clent Demark Sojourner Behringer M.D. Diseases & Surgery of the Retina and Vitreous [facility] [place] 12/08/19     Abbreviations: M myopia (nearsighted); A astigmatism; H hyperopia (farsighted); P presbyopia; Mrx spectacle prescription;  CTL contact lenses; OD right eye; OS left eye; OU both eyes  XT exotropia; ET esotropia; PEK punctate epithelial keratitis; PEE punctate epithelial erosions; DES dry eye syndrome; MGD  meibomian gland dysfunction; ATs artificial tears; PFAT's preservative free artificial tears; [place] nuclear sclerotic cataract; PSC posterior subcapsular cataract; ERM epi-retinal membrane; PVD posterior vitreous detachment; RD retinal detachment; DM diabetes mellitus; DR diabetic retinopathy; NPDR non-proliferative diabetic retinopathy; PDR proliferative diabetic retinopathy; CSME clinically significant macular edema; DME diabetic macular edema; dbh dot blot hemorrhages; CWS cotton wool spot; POAG primary open angle glaucoma; C/D cup-to-disc ratio; HVF humphrey visual field; GVF goldmann visual field; OCT optical coherence tomography; IOP intraocular pressure; BRVO Branch retinal vein occlusion; CRVO central retinal vein occlusion; CRAO central retinal artery occlusion; BRAO branch retinal artery occlusion; RT retinal tear; SB scleral buckle; PPV pars plana vitrectomy; VH Vitreous hemorrhage; PRP panretinal laser photocoagulation; IVK intravitreal kenalog; VMT vitreomacular traction; MH Macular hole;  NVD neovascularization of the disc; NVE neovascularization elsewhere; AREDS age related eye disease study; ARMD age related macular degeneration; POAG primary open angle glaucoma; EBMD epithelial/anterior basement membrane dystrophy; ACIOL anterior chamber intraocular lens; IOL intraocular lens; PCIOL posterior chamber intraocular lens; Phaco/IOL phacoemulsification with intraocular lens placement; [place] photorefractive keratectomy; LASIK laser assisted in situ keratomileusis; HTN hypertension; DM diabetes mellitus; COPD chronic obstructive pulmonary disease

## 2019-12-21 DIAGNOSIS — H52203 Unspecified astigmatism, bilateral: Secondary | ICD-10-CM | POA: Diagnosis not present

## 2019-12-21 DIAGNOSIS — H04123 Dry eye syndrome of bilateral lacrimal glands: Secondary | ICD-10-CM | POA: Diagnosis not present

## 2019-12-21 DIAGNOSIS — H524 Presbyopia: Secondary | ICD-10-CM | POA: Diagnosis not present

## 2019-12-21 DIAGNOSIS — H26492 Other secondary cataract, left eye: Secondary | ICD-10-CM | POA: Diagnosis not present

## 2020-02-11 DIAGNOSIS — R69 Illness, unspecified: Secondary | ICD-10-CM | POA: Diagnosis not present

## 2020-03-19 DIAGNOSIS — Z23 Encounter for immunization: Secondary | ICD-10-CM | POA: Diagnosis not present

## 2020-04-11 ENCOUNTER — Encounter (INDEPENDENT_AMBULATORY_CARE_PROVIDER_SITE_OTHER): Payer: Self-pay | Admitting: Ophthalmology

## 2020-04-11 ENCOUNTER — Other Ambulatory Visit: Payer: Self-pay

## 2020-04-11 ENCOUNTER — Ambulatory Visit (INDEPENDENT_AMBULATORY_CARE_PROVIDER_SITE_OTHER): Payer: Medicare HMO | Admitting: Ophthalmology

## 2020-04-11 ENCOUNTER — Encounter (INDEPENDENT_AMBULATORY_CARE_PROVIDER_SITE_OTHER): Payer: Medicare HMO | Admitting: Ophthalmology

## 2020-04-11 DIAGNOSIS — H353221 Exudative age-related macular degeneration, left eye, with active choroidal neovascularization: Secondary | ICD-10-CM

## 2020-04-11 DIAGNOSIS — H353132 Nonexudative age-related macular degeneration, bilateral, intermediate dry stage: Secondary | ICD-10-CM | POA: Diagnosis not present

## 2020-04-11 DIAGNOSIS — H35371 Puckering of macula, right eye: Secondary | ICD-10-CM | POA: Diagnosis not present

## 2020-04-11 NOTE — Assessment & Plan Note (Signed)
At 42-month follow-up OS today, serous retinal detachment noted at the onset of disease January 2020 has remained completely resolved.  We will continue to monitor closely and follow-up in 10 weeks.  Patient instructed to not notify the office promptly new onset visual acuity decline or distortion

## 2020-04-11 NOTE — Progress Notes (Signed)
04/11/2020     CHIEF COMPLAINT Patient presents for Retina Follow Up   HISTORY OF PRESENT ILLNESS: Jamie Duncan is a 79 y.o. female who presents to the clinic today for:   HPI    Retina Follow Up    Patient presents with  Wet AMD.  In left eye.  Severity is moderate.  Duration of 4 months.  Since onset it is stable.  I, the attending physician,  performed the HPI with the patient and updated documentation appropriately.          Comments    4 Month f\u OU. Possible Avastin OS. OCT  Pt states she has trouble focusing. Pt avoids driving at night.        Last edited by Tilda Franco on 04/11/2020  3:45 PM. (History)      Referring physician: Haywood Pao, MD [place],  [place] [address]  HISTORICAL INFORMATION:   Selected notes from the [facility]: Current Outpatient Medications (Ophthalmic Drugs)  Medication Sig  . moxifloxacin (VIGAMOX) 0.5 % ophthalmic solution PLACE 1 DROP 4 TIMES A DAY STARTING 2 DAYS PRIOR TO SURGERY & 2 DROP AM OF SURGERY  . prednisoLONE acetate (PRED FORTE) 1 % ophthalmic suspension PLACE 1 DROP TO OPERATIVE EYE 4 TIMES A DAY INTO LEFT EYE   No current facility-administered medications for this visit. (Ophthalmic Drugs)   Current Outpatient Medications (Other)  Medication Sig  . Ascorbic Acid (VITAMIN C PO) Take by mouth.    . Calcium Carbonate-Vit D-Min (CALTRATE PLUS PO) Take by mouth.    . fish oil-omega-3 fatty acids 1000 MG capsule Take 1 g by mouth daily.    . multivitamin (THERAGRAN) per tablet Take 1 tablet by mouth daily.    Marland Kitchen omeprazole (PRILOSEC) 20 MG capsule Take 20 mg by mouth daily.  . Psyllium (METAMUCIL PO) Take by mouth daily.  . Simvastatin (ZOCOR PO) Take 20 mg by mouth daily.    No current facility-administered medications for this visit. (Other)      REVIEW OF SYSTEMS:    ALLERGIES No Known Allergies  PAST MEDICAL HISTORY Past Medical History:    Diagnosis Date  . Cataract   . GERD (gastroesophageal reflux disease)   . Hypercholesteremia    Past Surgical History:  Procedure Laterality Date  . CATARACT EXTRACTION  06/2016  . COLONOSCOPY  multiple  . EYE SURGERY    . TONSILLECTOMY    . WISDOM TOOTH EXTRACTION      FAMILY HISTORY Family History  Problem Relation Age of Onset  . Colon cancer Mother 29  . Diabetes Mother   . Diabetes Father   . Hypertension Father   . Esophageal cancer Maternal Uncle   . Esophageal cancer Cousin        Maternal cousin  . Stomach cancer Neg Hx   . Rectal cancer Neg Hx     SOCIAL HISTORY Social History   Tobacco Use  . Smoking status: Former Research scientist (life sciences)  . Smokeless tobacco: Never Used  Vaping Use  . Vaping Use: Never used  Substance Use Topics  . Alcohol use: Yes    Alcohol/week: 2.0 standard drinks    Types: 2 Standard drinks or equivalent per week    Comment: wine, [place]trail socially  . Drug use: No         OPHTHALMIC EXAM:  Base Eye Exam    Visual Acuity (Snellen - Linear)  Right Left   Dist cc 20/30 -1 20/25 +   Correction: Glasses       Tonometry (Tonopen, 3:49 PM)      Right Left   Pressure 23 13       Pupils      Pupils Dark Light Shape React APD   Right PERRL 4 3 Round Brisk None   Left PERRL 4 3 Round Brisk None       Visual Fields (Counting fingers)      Left Right    Full Full       Neuro/Psych    Oriented x3: Yes   Mood/Affect: Normal       Dilation    Both eyes: 1.0% Mydriacyl, 2.5% Phenylephrine @ 3:49 PM        Slit Lamp and Fundus Exam    External Exam      Right Left   External Normal Normal       Slit Lamp Exam      Right Left   Lids/Lashes Normal Normal   Conjunctiva/Sclera White and quiet White and quiet   Cornea Clear Clear   Anterior Chamber Deep and quiet Deep and quiet   Iris Round and reactive Round and reactive   Lens Centered posterior chamber intraocular lens Centered posterior chamber intraocular lens    Anterior Vitreous Normal Normal       Fundus Exam      Right Left   Posterior Vitreous Posterior vitreous detachment Posterior vitreous detachment   Disc Normal Normal   C/D Ratio 0.45 0.4   Macula Hard drusen, no membrane, no macular thickening, no hemorrhage, no exudates Retinal pigment epithelial mottling, Hard drusen, no exudates, no hemorrhage, no macular thickening   Vessels Normal Normal   Periphery Normal Normal          IMAGING AND PROCEDURES  Imaging and Procedures for 04/11/20  OCT, Retina - OU - Both Eyes       Right Eye Scan locations included subfoveal. Central Foveal Thickness: 330. Progression has been stable. Findings include retinal drusen .   Left Eye Quality was good. Scan locations included subfoveal. Central Foveal Thickness: 255. Progression has improved. Findings include subretinal hyper-reflective material, subretinal scarring.   Notes OS with prior CNVM and serous retinal detachment, onset January 2020 now completely resolved and stable at 16-month follow-up post Avastin.  Will observe.  OD improved macular appearance post vitrectomy ILM peel for epiretinal membrane.                ASSESSMENT/PLAN:  Exudative age-related macular degeneration of left eye with active choroidal neovascularization ([place]) At 36-month follow-up OS today, serous retinal detachment noted at the onset of disease January 2020 has remained completely resolved.  We will continue to monitor closely and follow-up in 10 weeks.  Patient instructed to not notify the office promptly new onset visual acuity decline or distortion  Intermediate stage nonexudative age-related macular degeneration of both eyes The nature of age--related macular degeneration was discussed with the patient as well as the distinction between dry and wet types. Checking an Amsler Grid daily with advice to return immediately should a distortion develop, was given to the patient. The patient 's smoking  status now and in the past was determined and advice based on the AREDS study was provided regarding the consumption of antioxidant supplements. AREDS 2 vitamin formulation was recommended. Consumption of dark leafy vegetables and fresh fruits of various colors was recommended. Treatment modalities for wet macular  degeneration particularly the use of intravitreal injections of anti-blood vessel growth factors was discussed with the patient. Avastin, Lucentis, and Eylea are the available options. On occasion, therapy includes the use of photodynamic therapy and thermal laser. Stressed to the patient do not rub eyes.  Patient was advised to check Amsler Grid daily and return immediately if changes are noted. Instructions on using the grid were given to the patient. All patient questions were answered.      ICD-10-CM   1. Exudative age-related macular degeneration of left eye with active choroidal neovascularization (HCC)  H35.3221 OCT, Retina - OU - Both Eyes  2. Right epiretinal membrane  H35.371   3. Intermediate stage nonexudative age-related macular degeneration of both eyes  H35.3132     1.  Patient shall continue on vitamin, AREDS 2 formula for age-related maculopathy  2.  3.  Ophthalmic Meds Ordered this visit:  No orders of the defined types were placed in this encounter.      Return in about 10 weeks (around 06/20/2020) for DILATE OU, OCT.  There are no Patient Instructions on file for this visit.   Explained the diagnoses, plan, and follow up with the patient and they expressed understanding.  Patient expressed understanding of the importance of proper follow up care.   Clent Demark Brinna Divelbiss M.D. Diseases & Surgery of the Retina and Vitreous [facility]ina [facility] Diabetic [place] 04/11/20     Abbreviations: M myopia (nearsighted); A astigmatism; H hyperopia (farsighted); P presbyopia; Mrx spectacle prescription;  CTL contact lenses; OD right eye; OS left eye; OU both eyes  XT exotropia; ET  esotropia; PEK punctate epithelial keratitis; PEE punctate epithelial erosions; DES dry eye syndrome; MGD meibomian gland dysfunction; ATs artificial tears; PFAT's preservative free artificial tears; [place] nuclear sclerotic cataract; PSC posterior subcapsular cataract; ERM epi-retinal membrane; PVD posterior vitreous detachment; RD retinal detachment; DM diabetes mellitus; DR diabetic retinopathy; NPDR non-proliferative diabetic retinopathy; PDR proliferative diabetic retinopathy; CSME clinically significant macular edema; DME diabetic macular edema; dbh dot blot hemorrhages; CWS cotton wool spot; POAG primary open angle glaucoma; C/D cup-to-disc ratio; HVF humphrey visual field; GVF goldmann visual field; OCT optical coherence tomography; IOP intraocular pressure; BRVO Branch retinal vein occlusion; CRVO central retinal vein occlusion; CRAO central retinal artery occlusion; BRAO branch retinal artery occlusion; RT retinal tear; SB scleral buckle; PPV pars plana vitrectomy; VH Vitreous hemorrhage; PRP panretinal laser photocoagulation; IVK intravitreal kenalog; VMT vitreomacular traction; MH Macular hole;  NVD neovascularization of the disc; NVE neovascularization elsewhere; AREDS age related eye disease study; ARMD age related macular degeneration; POAG primary open angle glaucoma; EBMD epithelial/anterior basement membrane dystrophy; ACIOL anterior chamber intraocular lens; IOL intraocular lens; PCIOL posterior chamber intraocular lens; Phaco/IOL phacoemulsification with intraocular lens placement; Time photorefractive keratectomy; LASIK laser assisted in situ keratomileusis; HTN hypertension; DM diabetes mellitus; COPD chronic obstructive pulmonary disease

## 2020-04-11 NOTE — Assessment & Plan Note (Signed)

## 2020-04-26 DIAGNOSIS — R350 Frequency of micturition: Secondary | ICD-10-CM | POA: Diagnosis not present

## 2020-04-26 DIAGNOSIS — N390 Urinary tract infection, site not specified: Secondary | ICD-10-CM | POA: Diagnosis not present

## 2020-05-13 DIAGNOSIS — E7800 Pure hypercholesterolemia, unspecified: Secondary | ICD-10-CM | POA: Diagnosis not present

## 2020-05-13 DIAGNOSIS — M859 Disorder of bone density and structure, unspecified: Secondary | ICD-10-CM | POA: Diagnosis not present

## 2020-05-20 DIAGNOSIS — K219 Gastro-esophageal reflux disease without esophagitis: Secondary | ICD-10-CM | POA: Diagnosis not present

## 2020-05-20 DIAGNOSIS — M8580 Other specified disorders of bone density and structure, unspecified site: Secondary | ICD-10-CM | POA: Diagnosis not present

## 2020-05-20 DIAGNOSIS — M65819 Other synovitis and tenosynovitis, unspecified shoulder: Secondary | ICD-10-CM | POA: Diagnosis not present

## 2020-05-20 DIAGNOSIS — Z0000 Encounter for general adult medical examination without abnormal findings: Secondary | ICD-10-CM | POA: Diagnosis not present

## 2020-05-20 DIAGNOSIS — M545 Low back pain, unspecified: Secondary | ICD-10-CM | POA: Diagnosis not present

## 2020-05-20 DIAGNOSIS — R82998 Other abnormal findings in urine: Secondary | ICD-10-CM | POA: Diagnosis not present

## 2020-05-20 DIAGNOSIS — Z1212 Encounter for screening for malignant neoplasm of rectum: Secondary | ICD-10-CM | POA: Diagnosis not present

## 2020-05-20 DIAGNOSIS — K648 Other hemorrhoids: Secondary | ICD-10-CM | POA: Diagnosis not present

## 2020-05-20 DIAGNOSIS — E559 Vitamin D deficiency, unspecified: Secondary | ICD-10-CM | POA: Diagnosis not present

## 2020-05-20 DIAGNOSIS — Z800 Family history of malignant neoplasm of digestive organs: Secondary | ICD-10-CM | POA: Diagnosis not present

## 2020-05-20 DIAGNOSIS — R7301 Impaired fasting glucose: Secondary | ICD-10-CM | POA: Diagnosis not present

## 2020-06-16 ENCOUNTER — Encounter: Payer: Self-pay | Admitting: Nurse Practitioner

## 2020-06-16 ENCOUNTER — Other Ambulatory Visit: Payer: Self-pay

## 2020-06-16 ENCOUNTER — Ambulatory Visit (INDEPENDENT_AMBULATORY_CARE_PROVIDER_SITE_OTHER): Payer: Medicare HMO | Admitting: Nurse Practitioner

## 2020-06-16 VITALS — BP 110/66 | HR 70 | Resp 16 | Ht 64.75 in | Wt 159.0 lb

## 2020-06-16 DIAGNOSIS — Z01419 Encounter for gynecological examination (general) (routine) without abnormal findings: Secondary | ICD-10-CM | POA: Diagnosis not present

## 2020-06-16 DIAGNOSIS — Z780 Asymptomatic menopausal state: Secondary | ICD-10-CM | POA: Diagnosis not present

## 2020-06-16 NOTE — Patient Instructions (Signed)
Health Maintenance After Age 81 After age 81, you are at a higher risk for certain long-term diseases and infections as well as injuries from falls. Falls are a major cause of broken bones and head injuries in people who are older than age 81. Getting regular preventive care can help to keep you healthy and well. Preventive care includes getting regular testing and making lifestyle changes as recommended by your health care provider. Talk with your health care provider about:  Which screenings and tests you should have. A screening is a test that checks for a disease when you have no symptoms.  A diet and exercise plan that is right for you. What should I know about screenings and tests to prevent falls? Screening and testing are the best ways to find a health problem early. Early diagnosis and treatment give you the best chance of managing medical conditions that are common after age 81. Certain conditions and lifestyle choices may make you more likely to have a fall. Your health care provider may recommend:  Regular vision checks. Poor vision and conditions such as cataracts can make you more likely to have a fall. If you wear glasses, make sure to get your prescription updated if your vision changes.  Medicine review. Work with your health care provider to regularly review all of the medicines you are taking, including over-the-counter medicines. Ask your health care provider about any side effects that may make you more likely to have a fall. Tell your health care provider if any medicines that you take make you feel dizzy or sleepy.  Osteoporosis screening. Osteoporosis is a condition that causes the bones to get weaker. This can make the bones weak and cause them to break more easily.  Blood pressure screening. Blood pressure changes and medicines to control blood pressure can make you feel dizzy.  Strength and balance checks. Your health care provider may recommend certain tests to check your  strength and balance while standing, walking, or changing positions.  Foot health exam. Foot pain and numbness, as well as not wearing proper footwear, can make you more likely to have a fall.  Depression screening. You may be more likely to have a fall if you have a fear of falling, feel emotionally low, or feel unable to do activities that you used to do.  Alcohol use screening. Using too much alcohol can affect your balance and may make you more likely to have a fall. What actions can I take to lower my risk of falls? General instructions  Talk with your health care provider about your risks for falling. Tell your health care provider if: ? You fall. Be sure to tell your health care provider about all falls, even ones that seem minor. ? You feel dizzy, sleepy, or off-balance.  Take over-the-counter and prescription medicines only as told by your health care provider. These include any supplements.  Eat a healthy diet and maintain a healthy weight. A healthy diet includes low-fat dairy products, low-fat (lean) meats, and fiber from whole grains, beans, and lots of fruits and vegetables. Home safety  Remove any tripping hazards, such as rugs, cords, and clutter.  Install safety equipment such as grab bars in bathrooms and safety rails on stairs.  Keep rooms and walkways well-lit. Activity   Follow a regular exercise program to stay fit. This will help you maintain your balance. Ask your health care provider what types of exercise are appropriate for you.  If you need a cane or   walker, use it as recommended by your health care provider.  Wear supportive shoes that have nonskid soles. Lifestyle  Do not drink alcohol if your health care provider tells you not to drink.  If you drink alcohol, limit how much you have: ? 0-1 drink a day for women. ? 0-2 drinks a day for men.  Be aware of how much alcohol is in your drink. In the [place] one drink equals one typical bottle of beer (12  oz), one-half glass of wine (5 oz), or one shot of hard liquor (1 oz).  Do not use any products that contain nicotine or tobacco, such as cigarettes and e-cigarettes. If you need help quitting, ask your health care provider. Summary  Having a healthy lifestyle and getting preventive care can help to protect your health and wellness after age 81.  Screening and testing are the best way to find a health problem early and help you avoid having a fall. Early diagnosis and treatment give you the best chance for managing medical conditions that are more common for people who are older than age 81.  Falls are a major cause of broken bones and head injuries in people who are older than age 81. Take precautions to prevent a fall at home.  Work with your health care provider to learn what changes you can make to improve your health and wellness and to prevent falls. This information is not intended to replace advice given to you by your health care provider. Make sure you discuss any questions you have with your health care provider. Document Revised: 09/18/2018 Document Reviewed: 04/10/2017 Elsevier Patient Education  2020 Elsevier Inc.  

## 2020-06-16 NOTE — Progress Notes (Signed)
   Jamie Duncan 80 y.o. G1P1001 [id number]   History:  80 y.o. G1P1001 presents for breast and pelvic exam without GYN complaints. Postmenopausal - no HRT, no bleeding. Normal pap and mammogram history. Primary care manages osteopenia, HLD, GERD.   Gynecologic History No LMP recorded. Patient is postmenopausal.   Health Maintenance Last Pap: No longer screening per guidelines Last mammogram: 06/26/2019. Results were: Normal Last colonoscopy: 2013. Results were: Normal Last Dexa: Osteopenia of right hip  Past medical history, past surgical history, family history and social history were all reviewed and documented in the EPIC chart.  ROS:  A ROS was performed and pertinent positives and negatives are included.  Exam:  Vitals:   06/16/20 1435  BP: 110/66  Pulse: 70  Resp: 16  Weight: 159 lb (72.1 kg)  Height: 5' 4.75" (1.645 m)   Body mass index is 26.66 kg/m.  General appearance:  Normal Thyroid:  Symmetrical, normal in size, without palpable masses or nodularity. Respiratory  Auscultation:  Clear without wheezing or rhonchi Cardiovascular  Auscultation:  Regular rate, without rubs, murmurs or gallops  Edema/varicosities:  Not grossly evident Abdominal  Soft,nontender, without masses, guarding or rebound.  Liver/spleen:  No organomegaly noted  Hernia:  None appreciated  Skin  Inspection:  Grossly normal   Breasts: Examined lying and sitting.   Right: Without masses, retractions, discharge or axillary adenopathy.   Left: Without masses, retractions, discharge or axillary adenopathy. Gentitourinary   Inguinal/mons:  Normal without inguinal adenopathy  External genitalia:  Normal  BUS/Urethra/Skene's glands:  Normal  Vagina:  Atrophic  Cervix:  Normal  Uterus:  Normal in size, shape and contour.  Midline and mobile  Adnexa/parametria:     Rt: Without masses or tenderness.   Lt: Without masses or tenderness.  Anus and perineum: Normal  Digital rectal  exam: Declined  Assessment/Plan:  80 y.o. G1P1001 for breast and pelvic exam.   Well female exam with routine gynecological exam - Education provided on SBEs, importance of preventative screenings, current guidelines, high calcium diet, regular exercise, and multivitamin daily. Labs with PCP.   Screening for cervical cancer - Normal Pap history.  No longer screening per guidelines.  Screening for breast cancer - Normal mammogram history.  Continue annual screenings.  Normal breast exam today.  Screening for colon cancer -no longer performing colonoscopies per GI recommendation.  Patient does Cologuard.  Screening for osteoporosis -osteopenia of right hip.  Managed by PCP.  Scheduled for bone density next week.  Taking daily vitamin D supplement and walks some.  Follow-up in 2 years for CPE.     Olivia Mackie [facility], 3:02 PM 06/16/2020

## 2020-06-20 ENCOUNTER — Encounter (INDEPENDENT_AMBULATORY_CARE_PROVIDER_SITE_OTHER): Payer: Self-pay | Admitting: Ophthalmology

## 2020-06-20 ENCOUNTER — Ambulatory Visit (INDEPENDENT_AMBULATORY_CARE_PROVIDER_SITE_OTHER): Payer: Medicare HMO | Admitting: Ophthalmology

## 2020-06-20 ENCOUNTER — Other Ambulatory Visit: Payer: Self-pay

## 2020-06-20 DIAGNOSIS — R300 Dysuria: Secondary | ICD-10-CM | POA: Diagnosis not present

## 2020-06-20 DIAGNOSIS — H353221 Exudative age-related macular degeneration, left eye, with active choroidal neovascularization: Secondary | ICD-10-CM

## 2020-06-20 DIAGNOSIS — H353222 Exudative age-related macular degeneration, left eye, with inactive choroidal neovascularization: Secondary | ICD-10-CM

## 2020-06-20 DIAGNOSIS — H353132 Nonexudative age-related macular degeneration, bilateral, intermediate dry stage: Secondary | ICD-10-CM | POA: Diagnosis not present

## 2020-06-20 NOTE — Assessment & Plan Note (Signed)
Last therapy for active CNVM was June 2021.

## 2020-06-20 NOTE — Progress Notes (Signed)
06/20/2020     CHIEF COMPLAINT Patient presents for Retina Follow Up (10 Week f\u OU. OCT/Pt states vision is stable. Denies any significant changes.)   HISTORY OF PRESENT ILLNESS: Jamie Duncan is a 80 y.o. female who presents to the clinic today for:   HPI    Retina Follow Up    Patient presents with  Wet AMD.  In left eye.  Severity is moderate.  Duration of 10 weeks.  Since onset it is stable.  I, the attending physician,  performed the HPI with the patient and updated documentation appropriately. Additional comments: 10 Week f\u OU. OCT Pt states vision is stable. Denies any significant changes.       Last edited by Tilda Franco on 06/20/2020  1:54 PM. (History)      Referring physician: Haywood Pao, MD [place],  [place] [address]  HISTORICAL INFORMATION:   Selected notes from the [facility]: No current outpatient medications on file. (Ophthalmic Drugs)   No current facility-administered medications for this visit. (Ophthalmic Drugs)   Current Outpatient Medications (Other)  Medication Sig  . Ascorbic Acid (VITAMIN C PO) Take by mouth.  . Calcium Carbonate-Vit D-Min (CALTRATE PLUS PO) Take by mouth.  . fish oil-omega-3 fatty acids 1000 MG capsule Take 1 g by mouth daily.  . Multiple Vitamins-Minerals (ICAPS AREDS 2 PO) Take by mouth.  . multivitamin (THERAGRAN) per tablet Take 1 tablet by mouth daily.  Marland Kitchen omeprazole (PRILOSEC) 20 MG capsule Take 20 mg by mouth daily.  . Psyllium (METAMUCIL PO) Take by mouth daily.   No current facility-administered medications for this visit. (Other)      REVIEW OF SYSTEMS:    ALLERGIES No Known Allergies  PAST MEDICAL HISTORY Past Medical History:  Diagnosis Date  . Cataract   . GERD (gastroesophageal reflux disease)   . Hypercholesteremia    Past Surgical History:  Procedure Laterality Date  . CATARACT EXTRACTION     bilateral  . COLONOSCOPY   multiple  . EYE SURGERY    . TONSILLECTOMY    . WISDOM TOOTH EXTRACTION      FAMILY HISTORY Family History  Problem Relation Age of Onset  . Colon cancer Mother 52  . Diabetes Mother   . Diabetes Father   . Hypertension Father   . Esophageal cancer Maternal Uncle   . Esophageal cancer Cousin        Maternal cousin  . Stomach cancer Neg Hx   . Rectal cancer Neg Hx     SOCIAL HISTORY Social History   Tobacco Use  . Smoking status: Former Research scientist (life sciences)  . Smokeless tobacco: Never Used  Vaping Use  . Vaping Use: Never used  Substance Use Topics  . Alcohol use: Not Currently  . Drug use: No         OPHTHALMIC EXAM: Base Eye Exam    Visual Acuity (Snellen - Linear)      Right Left   Dist cc 20/40 20/25 +   Dist ph cc NI    Correction: Glasses       Tonometry (Tonopen, 1:57 PM)      Right Left   Pressure 16 17       Pupils      Pupils Dark Light Shape React APD   Right PERRL 4 3 Round Brisk None   Left PERRL 4 3 Round Brisk None  Visual Fields (Counting fingers)      Left Right    Full Full       Neuro/Psych    Oriented x3: Yes   Mood/Affect: Normal       Dilation    Both eyes: 1.0% Mydriacyl, 2.5% Phenylephrine @ 1:57 PM        Slit Lamp and Fundus Exam    External Exam      Right Left   External Normal Normal       Slit Lamp Exam      Right Left   Lids/Lashes Normal Normal   Conjunctiva/Sclera White and quiet White and quiet   Cornea Clear Clear   Anterior Chamber Deep and quiet Deep and quiet   Iris Round and reactive Round and reactive   Lens Centered posterior chamber intraocular lens Centered posterior chamber intraocular lens   Anterior Vitreous Normal Normal       Fundus Exam      Right Left   Posterior Vitreous Clear vitrectomized Posterior vitreous detachment   Disc Normal Normal. peripapillary atrophy   C/D Ratio 0.45 0.4   Macula Hard drusen, no membrane, no macular thickening, no hemorrhage, no exudates Retinal pigment  epithelial mottling, Hard drusen, no exudates, no hemorrhage, no macular thickening   Vessels Normal Normal   Periphery Normal Normal          IMAGING AND PROCEDURES  Imaging and Procedures for 06/20/20  OCT, Retina - OU - Both Eyes       Right Eye Quality was good. Scan locations included subfoveal. Central Foveal Thickness: 328. Progression has been stable. Findings include abnormal foveal contour, no IRF, no SRF, retinal drusen .   Left Eye Quality was good. Scan locations included subfoveal. Central Foveal Thickness: 256. Progression has improved. Findings include abnormal foveal contour, disciform scar, no IRF, no SRF, retinal drusen .   Notes OD, no recurrence of epiretinal membrane, improved overall as compared to presence of ERM 4 years previous  OS, history of CNVM and subretinal fluid onset January 2020.  No signs of CN VM at this time.  Last injection was some 6.5 months previous.  Will observe                ASSESSMENT/PLAN:  Exudative age-related macular degeneration of left eye with inactive choroidal neovascularization ([place]) Last therapy for active CNVM was June 2021.  Intermediate stage nonexudative age-related macular degeneration of both eyes Stable OU at this time      ICD-10-CM   1. Exudative age-related macular degeneration of left eye with active choroidal neovascularization ([facility])  H35.3221 OCT, Retina - OU - Both Eyes  2. Exudative age-related macular degeneration of left eye with inactive choroidal neovascularization ([place])  H35.3222   3. Intermediate stage nonexudative age-related macular degeneration of both eyes  H35.3132     1.  OS looks great, no active disease, now 6.5 months post last injection of antivegF.  We will continue to observe  2.  History of ERM, stable OD  3.  Ophthalmic Meds Ordered this visit:  No orders of the defined types were placed in this encounter.      Return in about 5 months (around 11/18/2020) for  OCT.  There are no Patient Instructions on file for this visit.   Explained the diagnoses, plan, and follow up with the patient and they expressed understanding.  Patient expressed understanding of the importance of proper follow up care.   Clent Demark Bri Wakeman M.D. Diseases &  Surgery of the Retina and Vitreous [facility]ina [facility] Diabetic [facility] 06/20/20     Abbreviations: M myopia (nearsighted); A astigmatism; H hyperopia (farsighted); P presbyopia; Mrx spectacle prescription;  CTL contact lenses; OD right eye; OS left eye; OU both eyes  XT exotropia; ET esotropia; PEK punctate epithelial keratitis; PEE punctate epithelial erosions; DES dry eye syndrome; MGD meibomian gland dysfunction; ATs artificial tears; PFAT's preservative free artificial tears; [place] nuclear sclerotic cataract; PSC posterior subcapsular cataract; ERM epi-retinal membrane; PVD posterior vitreous detachment; RD retinal detachment; DM diabetes mellitus; DR diabetic retinopathy; NPDR non-proliferative diabetic retinopathy; PDR proliferative diabetic retinopathy; CSME clinically significant macular edema; DME diabetic macular edema; dbh dot blot hemorrhages; CWS cotton wool spot; POAG primary open angle glaucoma; C/D cup-to-disc ratio; HVF humphrey visual field; GVF goldmann visual field; OCT optical coherence tomography; IOP intraocular pressure; BRVO Branch retinal vein occlusion; CRVO central retinal vein occlusion; CRAO central retinal artery occlusion; BRAO branch retinal artery occlusion; RT retinal tear; SB scleral buckle; PPV pars plana vitrectomy; VH Vitreous hemorrhage; PRP panretinal laser photocoagulation; IVK intravitreal kenalog; VMT vitreomacular traction; MH Macular hole;  NVD neovascularization of the disc; NVE neovascularization elsewhere; AREDS age related eye disease study; ARMD age related macular degeneration; POAG primary open angle glaucoma; EBMD epithelial/anterior basement membrane dystrophy; ACIOL anterior chamber  intraocular lens; IOL intraocular lens; PCIOL posterior chamber intraocular lens; Phaco/IOL phacoemulsification with intraocular lens placement; [place] photorefractive keratectomy; LASIK laser assisted in situ keratomileusis; HTN hypertension; DM diabetes mellitus; COPD chronic obstructive pulmonary disease

## 2020-06-20 NOTE — Assessment & Plan Note (Signed)
Stable OU at this time

## 2020-06-23 DIAGNOSIS — M8589 Other specified disorders of bone density and structure, multiple sites: Secondary | ICD-10-CM | POA: Diagnosis not present

## 2020-06-23 DIAGNOSIS — E559 Vitamin D deficiency, unspecified: Secondary | ICD-10-CM | POA: Diagnosis not present

## 2020-06-23 DIAGNOSIS — R300 Dysuria: Secondary | ICD-10-CM | POA: Diagnosis not present

## 2020-07-11 DIAGNOSIS — D225 Melanocytic nevi of trunk: Secondary | ICD-10-CM | POA: Diagnosis not present

## 2020-07-11 DIAGNOSIS — D485 Neoplasm of uncertain behavior of skin: Secondary | ICD-10-CM | POA: Diagnosis not present

## 2020-07-11 DIAGNOSIS — L570 Actinic keratosis: Secondary | ICD-10-CM | POA: Diagnosis not present

## 2020-07-11 DIAGNOSIS — D1801 Hemangioma of skin and subcutaneous tissue: Secondary | ICD-10-CM | POA: Diagnosis not present

## 2020-07-11 DIAGNOSIS — D1722 Benign lipomatous neoplasm of skin and subcutaneous tissue of left arm: Secondary | ICD-10-CM | POA: Diagnosis not present

## 2020-07-11 DIAGNOSIS — L821 Other seborrheic keratosis: Secondary | ICD-10-CM | POA: Diagnosis not present

## 2020-08-02 ENCOUNTER — Other Ambulatory Visit: Payer: Self-pay | Admitting: Internal Medicine

## 2020-08-02 DIAGNOSIS — Z1231 Encounter for screening mammogram for malignant neoplasm of breast: Secondary | ICD-10-CM

## 2020-09-22 ENCOUNTER — Ambulatory Visit
Admission: RE | Admit: 2020-09-22 | Discharge: 2020-09-22 | Disposition: A | Discharge status: Home / Self Care | Payer: Medicare HMO | Source: Ambulatory Visit | Attending: Internal Medicine | Admitting: Internal Medicine

## 2020-09-22 ENCOUNTER — Other Ambulatory Visit: Payer: Self-pay

## 2020-09-22 DIAGNOSIS — Z1231 Encounter for screening mammogram for malignant neoplasm of breast: Secondary | ICD-10-CM

## 2020-10-19 DIAGNOSIS — H6121 Impacted cerumen, right ear: Secondary | ICD-10-CM | POA: Diagnosis not present

## 2020-10-19 DIAGNOSIS — H9191 Unspecified hearing loss, right ear: Secondary | ICD-10-CM | POA: Diagnosis not present

## 2020-11-21 ENCOUNTER — Encounter (INDEPENDENT_AMBULATORY_CARE_PROVIDER_SITE_OTHER): Payer: Self-pay | Admitting: Ophthalmology

## 2020-11-21 ENCOUNTER — Other Ambulatory Visit: Payer: Self-pay

## 2020-11-21 ENCOUNTER — Ambulatory Visit (INDEPENDENT_AMBULATORY_CARE_PROVIDER_SITE_OTHER): Payer: Medicare HMO | Admitting: Ophthalmology

## 2020-11-21 DIAGNOSIS — H35371 Puckering of macula, right eye: Secondary | ICD-10-CM

## 2020-11-21 DIAGNOSIS — H353132 Nonexudative age-related macular degeneration, bilateral, intermediate dry stage: Secondary | ICD-10-CM

## 2020-11-21 DIAGNOSIS — H353222 Exudative age-related macular degeneration, left eye, with inactive choroidal neovascularization: Secondary | ICD-10-CM

## 2020-11-21 NOTE — Assessment & Plan Note (Signed)
History of repair epiretinal membrane vitrectomy membrane peel 2019

## 2020-11-21 NOTE — Patient Instructions (Signed)
Age-Related Macular Degeneration  Age-related macular degeneration (AMD) is an eye disease related to aging. The disease causes a loss of central vision. Central vision allows a person to seeobjects clearly and do daily tasks like reading and driving. There are two main types of AMD: Dry AMD. People with this type generally lose their vision slowly. This is the most common type of AMD. Some people with dry AMD notice very little change in their vision as they age. Wet AMD. People with this type can lose their vision quickly. What are the causes? This condition is caused by damage to the part of the eye that provides you with central vision (macula). Dry AMD happens when deposits in the macula cause light-sensitive cells to slowly break down. Wet AMD happens when abnormal blood vessels grow under the macula and leak blood and fluid. What increases the risk? You are more likely to develop this condition if you: Are 81 years old or older, and especially 62 years old or older. Smoke. Are obese. Have a family history of AMD. Have high cholesterol, high blood pressure, or heart disease. Have been exposed to high levels of ultraviolet (UV) light and blue light. Are white (Caucasian). Are female. What are the signs or symptoms? Common symptoms of this condition include: Blurred vision, especially when reading print material. The blurred vision often improves in brighter light. A blurred or blind spot in the center of your field of vision that is small but growing larger. Bright colors seeming less bright than they used to be. Decreased ability to recognize and see faces. One eye seeing worse than the other. Decreased ability to adapt to dimly lit rooms. Straight lines appearing crooked or wavy. How is this diagnosed? This condition is diagnosed based on your symptoms and an eye exam. During the eye exam: Eye drops will be placed into your eyes to enlarge (dilate) your pupils. This will allow  your health care provider to see the back of your eye. You may be asked to look at an image that looks like a checkerboard (Amsler grid). Early changes in your central vision may cause the grid to appear distorted. After the exam, you may be given one or both of these tests: Fluorescein angiogram. This test determines whether you have dry or wet AMD. Optical coherence tomography (OCT) test to evaluate deep layers of the retina. How is this treated? There is no cure for this condition, but treatment can help to slow down progression of the disease. This condition may be treated with: Supplements, including vitamin C, vitamin E, beta carotene, and zinc. Laser surgery to destroy new blood vessels or leaking blood vessels in your eye. Injections of medicines into your eye to slow down the formation of abnormal blood vessels that may leak. These injections may need to be repeated on a routine basis. Follow these instructions at home: Take over-the-counter and prescription medicines only as told by your health care provider. Take vitamins and supplements as told by your health care provider. Ask your health care provider for an Amsler grid. Use it every day to check each eye for vision changes. Get an eye exam as often as told by your health care provider. Make sure to get an eye exam at least once every year. Keep all follow-up visits as told by your health care provider. This is important. Contact a health care provider if: You notice any new changes in your vision. Get help right away if: You suddenly lose vision or develop  pain in the eye. Summary Age-related macular degeneration (AMD) is an eye disease related to aging. There are two types of this condition: dry AMD and wet AMD. This condition is caused by damage to the part of the eye that provides you with central vision (macula). Once diagnosed with AMD, make sure to get an eye exam every year, take supplements and vitamins as directed, use  an Amsler grid at home, and follow up with your health care provider. This information is not intended to replace advice given to you by your health care provider. Make sure you discuss any questions you have with your healthcare provider. Document Revised: 12/04/2017 Document Reviewed: 12/04/2017 Elsevier Patient Education  [place].

## 2020-11-21 NOTE — Assessment & Plan Note (Signed)
No signs of developing CNVM right eye

## 2020-11-21 NOTE — Progress Notes (Signed)
11/21/2020     CHIEF COMPLAINT Patient presents for Retina Follow Up (5 month fu OU and OCT/Pt states, "I can tell that at times in my OS I can tell that my va is cloudy. I have been noticing it more and more."/)   HISTORY OF PRESENT ILLNESS: Jamie Duncan is a 80 y.o. female who presents to the clinic today for:   HPI     Retina Follow Up           Diagnosis: Wet AMD   Laterality: both eyes   Onset: 5 months ago   Severity: mild   Duration: 5 months   Course: gradually worsening   Comments: 5 month fu OU and OCT Pt states, "I can tell that at times in my OS I can tell that my va is cloudy. I have been noticing it more and more."          Comments   No interval change in symptoms per patient      Last edited by Hurman Horn, MD on 11/21/2020  2:03 PM.      Referring physician: Haywood Pao, MD [place],  [place] [address]  HISTORICAL INFORMATION:   Selected notes from the [facility]: No current outpatient medications on file. (Ophthalmic Drugs)   No current facility-administered medications for this visit. (Ophthalmic Drugs)   Current Outpatient Medications (Other)  Medication Sig   Ascorbic Acid (VITAMIN C PO) Take by mouth.   Calcium Carbonate-Vit D-Min (CALTRATE PLUS PO) Take by mouth.   fish oil-omega-3 fatty acids 1000 MG capsule Take 1 g by mouth daily.   Multiple Vitamins-Minerals (ICAPS AREDS 2 PO) Take by mouth.   multivitamin (THERAGRAN) per tablet Take 1 tablet by mouth daily.   omeprazole (PRILOSEC) 20 MG capsule Take 20 mg by mouth daily.   Psyllium (METAMUCIL PO) Take by mouth daily.   No current facility-administered medications for this visit. (Other)      REVIEW OF SYSTEMS:    ALLERGIES No Known Allergies  PAST MEDICAL HISTORY Past Medical History:  Diagnosis Date   Cataract    Cataract    GERD (gastroesophageal reflux disease)    Hypercholesteremia    Past  Surgical History:  Procedure Laterality Date   CATARACT EXTRACTION     bilateral   COLONOSCOPY  multiple   EYE SURGERY     TONSILLECTOMY     WISDOM TOOTH EXTRACTION      FAMILY HISTORY Family History  Problem Relation Age of Onset   Colon cancer Mother 42   Diabetes Mother    Diabetes Father    Hypertension Father    Esophageal cancer Maternal Uncle    Esophageal cancer Cousin        Maternal cousin   Stomach cancer Neg Hx    Rectal cancer Neg Hx     SOCIAL HISTORY Social History   Tobacco Use   Smoking status: Former    Pack years: 0.00   Smokeless tobacco: Never  Vaping Use   Vaping Use: Never used  Substance Use Topics   Alcohol use: Not Currently   Drug use: No         OPHTHALMIC EXAM:  Base Eye Exam     Visual Acuity (ETDRS)       Right Left   Dist cc 20/30 -1 20/25 -1   Dist ph cc NI  Tonometry (Tonopen, 1:23 PM)       Right Left   Pressure 20 19         Pupils       Pupils Dark Light Shape React APD   Right PERRL 4 3 Round Brisk None   Left PERRL 4 3 Round Brisk None         Visual Fields (Counting fingers)       Left Right    Full Full         Extraocular Movement       Right Left    Full Full         Neuro/Psych     Oriented x3: Yes   Mood/Affect: Normal         Dilation     Both eyes: 1.0% Mydriacyl, 2.5% Phenylephrine @ 1:23 PM           Slit Lamp and Fundus Exam     External Exam       Right Left   External Normal Normal         Slit Lamp Exam       Right Left   Lids/Lashes Normal Normal   Conjunctiva/Sclera White and quiet White and quiet   Cornea Clear Clear   Anterior Chamber Deep and quiet Deep and quiet   Iris Round and reactive Round and reactive   Lens Centered posterior chamber intraocular lens Centered posterior chamber intraocular lens   Anterior Vitreous Normal Normal         Fundus Exam       Right Left   Posterior Vitreous Clear vitrectomized Posterior  vitreous detachment, Central vitreous floaters   Disc Normal Normal. peripapillary atrophy   C/D Ratio 0.45 0.4   Macula Hard drusen, no membrane, no macular thickening, no hemorrhage, no exudates Retinal pigment epithelial mottling, Hard drusen, no exudates, no hemorrhage, no macular thickening   Vessels Normal Normal   Periphery Normal, no holes or tears Normal, no holes or tears            IMAGING AND PROCEDURES  Imaging and Procedures for 11/21/20  OCT, Retina - OU - Both Eyes       Right Eye Quality was good. Scan locations included subfoveal. Central Foveal Thickness: 322. Progression has been stable. Findings include abnormal foveal contour, no IRF, no SRF, retinal drusen .   Left Eye Quality was good. Scan locations included subfoveal. Central Foveal Thickness: 257. Progression has improved. Findings include abnormal foveal contour, disciform scar, no IRF, no SRF, retinal drusen .   Notes OD, no recurrence of epiretinal membrane, improved overall as compared to presence of ERM 4 years previous  OS, history of CNVM and subretinal fluid onset January 2020.  No signs of CN VM at this time.  Last injection was some 12 months months previous.  Will observe             ASSESSMENT/PLAN:  Exudative age-related macular degeneration of left eye with inactive choroidal neovascularization (HCC) No signs of recurrence of CNVM left eye  Intermediate stage nonexudative age-related macular degeneration of both eyes No signs of developing CNVM right eye  Right epiretinal membrane History of repair epiretinal membrane vitrectomy membrane peel 2019     ICD-10-CM   1. Exudative age-related macular degeneration of left eye with inactive choroidal neovascularization (HCC)  H35.3222 OCT, Retina - OU - Both Eyes    2. Intermediate stage nonexudative age-related macular degeneration of both eyes  H35.3132  OCT, Retina - OU - Both Eyes    3. Right epiretinal membrane  H35.371 OCT,  Retina - OU - Both Eyes      1.  OD, stable acuity, mild ARMD no signs of CNVM.  2.  OS, history of resolved CNVM last treatment June 2021 of antivegF stable over time  3.  Patient instructed to promptly notify the office new onset visual acuity changes or distortions  Ophthalmic Meds Ordered this visit:  No orders of the defined types were placed in this encounter.      Return in about 6 months (around 05/23/2021) for DILATE OU, OCT.  Patient Instructions  Age-Related Macular Degeneration  Age-related macular degeneration (AMD) is an eye disease related to aging. The disease causes a loss of central vision. Central vision allows a person to seeobjects clearly and do daily tasks like reading and driving. There are two main types of AMD: Dry AMD. People with this type generally lose their vision slowly. This is the most common type of AMD. Some people with dry AMD notice very little change in their vision as they age. Wet AMD. People with this type can lose their vision quickly. What are the causes? This condition is caused by damage to the part of the eye that provides you with central vision (macula). Dry AMD happens when deposits in the macula cause light-sensitive cells to slowly break down. Wet AMD happens when abnormal blood vessels grow under the macula and leak blood and fluid. What increases the risk? You are more likely to develop this condition if you: Are 59 years old or older, and especially 33 years old or older. Smoke. Are obese. Have a family history of AMD. Have high cholesterol, high blood pressure, or heart disease. Have been exposed to high levels of ultraviolet (UV) light and blue light. Are white (Caucasian). Are female. What are the signs or symptoms? Common symptoms of this condition include: Blurred vision, especially when reading print material. The blurred vision often improves in brighter light. A blurred or blind spot in the center of your field  of vision that is small but growing larger. Bright colors seeming less bright than they used to be. Decreased ability to recognize and see faces. One eye seeing worse than the other. Decreased ability to adapt to dimly lit rooms. Straight lines appearing crooked or wavy. How is this diagnosed? This condition is diagnosed based on your symptoms and an eye exam. During the eye exam: Eye drops will be placed into your eyes to enlarge (dilate) your pupils. This will allow your health care provider to see the back of your eye. You may be asked to look at an image that looks like a checkerboard (Amsler grid). Early changes in your central vision may cause the grid to appear distorted. After the exam, you may be given one or both of these tests: Fluorescein angiogram. This test determines whether you have dry or wet AMD. Optical coherence tomography (OCT) test to evaluate deep layers of the retina. How is this treated? There is no cure for this condition, but treatment can help to slow down progression of the disease. This condition may be treated with: Supplements, including vitamin C, vitamin E, beta carotene, and zinc. Laser surgery to destroy new blood vessels or leaking blood vessels in your eye. Injections of medicines into your eye to slow down the formation of abnormal blood vessels that may leak. These injections may need to be repeated on a routine basis. Follow  these instructions at home: Take over-the-counter and prescription medicines only as told by your health care provider. Take vitamins and supplements as told by your health care provider. Ask your health care provider for an Amsler grid. Use it every day to check each eye for vision changes. Get an eye exam as often as told by your health care provider. Make sure to get an eye exam at least once every year. Keep all follow-up visits as told by your health care provider. This is important. Contact a health care provider if: You  notice any new changes in your vision. Get help right away if: You suddenly lose vision or develop pain in the eye. Summary Age-related macular degeneration (AMD) is an eye disease related to aging. There are two types of this condition: dry AMD and wet AMD. This condition is caused by damage to the part of the eye that provides you with central vision (macula). Once diagnosed with AMD, make sure to get an eye exam every year, take supplements and vitamins as directed, use an Amsler grid at home, and follow up with your health care provider. This information is not intended to replace advice given to you by your health care provider. Make sure you discuss any questions you have with your healthcare provider. Document Revised: 12/04/2017 Document Reviewed: 12/04/2017 Elsevier Patient Education  2022 [place] the diagnoses, plan, and follow up with the patient and they expressed understanding.  Patient expressed understanding of the importance of proper follow up care.   Clent Demark Tasheika Kitzmiller M.D. Diseases & Surgery of the Retina and Vitreous Retina & Diabetic [place] 11/21/20     Abbreviations: M myopia (nearsighted); A astigmatism; H hyperopia (farsighted); P presbyopia; Mrx spectacle prescription;  CTL contact lenses; OD right eye; OS left eye; OU both eyes  XT exotropia; ET esotropia; PEK punctate epithelial keratitis; PEE punctate epithelial erosions; DES dry eye syndrome; MGD meibomian gland dysfunction; ATs artificial tears; PFAT's preservative free artificial tears; [place] nuclear sclerotic cataract; PSC posterior subcapsular cataract; ERM epi-retinal membrane; PVD posterior vitreous detachment; RD retinal detachment; DM diabetes mellitus; DR diabetic retinopathy; NPDR non-proliferative diabetic retinopathy; PDR proliferative diabetic retinopathy; CSME clinically significant macular edema; DME diabetic macular edema; dbh dot blot hemorrhages; CWS cotton wool spot; POAG primary  open angle glaucoma; C/D cup-to-disc ratio; HVF humphrey visual field; GVF goldmann visual field; OCT optical coherence tomography; IOP intraocular pressure; BRVO Branch retinal vein occlusion; CRVO central retinal vein occlusion; CRAO central retinal artery occlusion; BRAO branch retinal artery occlusion; RT retinal tear; SB scleral buckle; PPV pars plana vitrectomy; VH Vitreous hemorrhage; PRP panretinal laser photocoagulation; IVK intravitreal kenalog; VMT vitreomacular traction; MH Macular hole;  NVD neovascularization of the disc; NVE neovascularization elsewhere; AREDS age related eye disease study; ARMD age related macular degeneration; POAG primary open angle glaucoma; EBMD epithelial/anterior basement membrane dystrophy; ACIOL anterior chamber intraocular lens; IOL intraocular lens; PCIOL posterior chamber intraocular lens; Phaco/IOL phacoemulsification with intraocular lens placement; [place] photorefractive keratectomy; LASIK laser assisted in situ keratomileusis; HTN hypertension; DM diabetes mellitus; COPD chronic obstructive pulmonary disease

## 2020-11-21 NOTE — Assessment & Plan Note (Signed)
No signs of recurrence of CNVM left eye

## 2020-11-24 DIAGNOSIS — M8580 Other specified disorders of bone density and structure, unspecified site: Secondary | ICD-10-CM | POA: Diagnosis not present

## 2020-11-24 DIAGNOSIS — K219 Gastro-esophageal reflux disease without esophagitis: Secondary | ICD-10-CM | POA: Diagnosis not present

## 2020-11-24 DIAGNOSIS — Z1339 Encounter for screening examination for other mental health and behavioral disorders: Secondary | ICD-10-CM | POA: Diagnosis not present

## 2020-11-24 DIAGNOSIS — E559 Vitamin D deficiency, unspecified: Secondary | ICD-10-CM | POA: Diagnosis not present

## 2020-11-24 DIAGNOSIS — R7301 Impaired fasting glucose: Secondary | ICD-10-CM | POA: Diagnosis not present

## 2020-11-24 DIAGNOSIS — M65819 Other synovitis and tenosynovitis, unspecified shoulder: Secondary | ICD-10-CM | POA: Diagnosis not present

## 2020-11-24 DIAGNOSIS — Z800 Family history of malignant neoplasm of digestive organs: Secondary | ICD-10-CM | POA: Diagnosis not present

## 2020-11-24 DIAGNOSIS — Z1331 Encounter for screening for depression: Secondary | ICD-10-CM | POA: Diagnosis not present

## 2020-11-24 DIAGNOSIS — E7800 Pure hypercholesterolemia, unspecified: Secondary | ICD-10-CM | POA: Diagnosis not present

## 2020-11-24 DIAGNOSIS — K648 Other hemorrhoids: Secondary | ICD-10-CM | POA: Diagnosis not present

## 2020-12-21 DIAGNOSIS — H26492 Other secondary cataract, left eye: Secondary | ICD-10-CM | POA: Diagnosis not present

## 2020-12-21 DIAGNOSIS — H524 Presbyopia: Secondary | ICD-10-CM | POA: Diagnosis not present

## 2020-12-21 DIAGNOSIS — H353132 Nonexudative age-related macular degeneration, bilateral, intermediate dry stage: Secondary | ICD-10-CM | POA: Diagnosis not present

## 2020-12-21 DIAGNOSIS — H43813 Vitreous degeneration, bilateral: Secondary | ICD-10-CM | POA: Diagnosis not present

## 2021-05-23 ENCOUNTER — Other Ambulatory Visit: Payer: Self-pay

## 2021-05-23 ENCOUNTER — Ambulatory Visit (INDEPENDENT_AMBULATORY_CARE_PROVIDER_SITE_OTHER): Payer: Medicare HMO | Admitting: Ophthalmology

## 2021-05-23 DIAGNOSIS — Z9889 Other specified postprocedural states: Secondary | ICD-10-CM | POA: Insufficient documentation

## 2021-05-23 DIAGNOSIS — H43812 Vitreous degeneration, left eye: Secondary | ICD-10-CM | POA: Insufficient documentation

## 2021-05-23 DIAGNOSIS — H353222 Exudative age-related macular degeneration, left eye, with inactive choroidal neovascularization: Secondary | ICD-10-CM | POA: Diagnosis not present

## 2021-05-23 DIAGNOSIS — H353132 Nonexudative age-related macular degeneration, bilateral, intermediate dry stage: Secondary | ICD-10-CM

## 2021-05-23 NOTE — Assessment & Plan Note (Addendum)
History of wet AMD OS last injection OS was 17 months previous, no new onset distortions,, and active at this time

## 2021-05-23 NOTE — Progress Notes (Signed)
05/23/2021     CHIEF COMPLAINT Patient presents for  Chief Complaint  Patient presents with   Macular Degeneration      HISTORY OF PRESENT ILLNESS: Jamie Duncan is a 80 y.o. female who presents to the clinic today for:   HPI   History of wet AMD OS, last injection 17 months previous  No interval history of onset of visual acuity distortions or decline in either eye.  No medical changes in health history last 6 months nor change in medications nor new allergies Last edited by Hurman Horn, MD on 05/23/2021  1:21 PM.      Referring physician: Haywood Pao, MD [place],  [place] [address]  HISTORICAL INFORMATION:   Selected notes from the [facility]: No current outpatient medications on file. (Ophthalmic Drugs)   No current facility-administered medications for this visit. (Ophthalmic Drugs)   Current Outpatient Medications (Other)  Medication Sig   Ascorbic Acid (VITAMIN C PO) Take by mouth.   Calcium Carbonate-Vit D-Min (CALTRATE PLUS PO) Take by mouth.   fish oil-omega-3 fatty acids 1000 MG capsule Take 1 g by mouth daily.   Multiple Vitamins-Minerals (ICAPS AREDS 2 PO) Take by mouth.   multivitamin (THERAGRAN) per tablet Take 1 tablet by mouth daily.   omeprazole (PRILOSEC) 20 MG capsule Take 20 mg by mouth daily.   Psyllium (METAMUCIL PO) Take by mouth daily.   No current facility-administered medications for this visit. (Other)      REVIEW OF SYSTEMS:    ALLERGIES No Known Allergies  PAST MEDICAL HISTORY Past Medical History:  Diagnosis Date   Cataract    Cataract    GERD (gastroesophageal reflux disease)    Hypercholesteremia    Past Surgical History:  Procedure Laterality Date   CATARACT EXTRACTION     bilateral   COLONOSCOPY  multiple   EYE SURGERY     TONSILLECTOMY     WISDOM TOOTH EXTRACTION      FAMILY HISTORY Family History  Problem Relation Age of Onset   Colon  cancer Mother 32   Diabetes Mother    Diabetes Father    Hypertension Father    Esophageal cancer Maternal Uncle    Esophageal cancer Cousin        Maternal cousin   Stomach cancer Neg Hx    Rectal cancer Neg Hx     SOCIAL HISTORY Social History   Tobacco Use   Smoking status: Former   Smokeless tobacco: Never  Scientific laboratory technician Use: Never used  Substance Use Topics   Alcohol use: Not Currently   Drug use: No         OPHTHALMIC EXAM:  Base Eye Exam     Visual Acuity (ETDRS)       Right Left   Dist cc 20/25 -1 20/20    Correction: Glasses         Tonometry (Tonopen, 1:20 PM)       Right Left   Pressure 17 15         Pupils       Pupils APD   Right PERRL None   Left PERRL None         Visual Fields       Left Right    Full Full         Extraocular Movement       Right Left  Full, Ortho Full, Ortho         Neuro/Psych     Oriented x3: Yes   Mood/Affect: Normal         Dilation     Both eyes: 1.0% Mydriacyl, 2.5% Phenylephrine @ 1:18 PM           Slit Lamp and Fundus Exam     External Exam       Right Left   External Normal Normal         Slit Lamp Exam       Right Left   Lids/Lashes Normal Normal   Conjunctiva/Sclera White and quiet White and quiet   Cornea Clear Clear   Anterior Chamber Deep and quiet Deep and quiet   Iris Round and reactive Round and reactive   Lens Centered posterior chamber intraocular lens Centered posterior chamber intraocular lens   Anterior Vitreous Normal Normal         Fundus Exam       Right Left   Posterior Vitreous Clear vitrectomized Posterior vitreous detachment, Central vitreous floaters   Disc Normal Normal. peripapillary atrophy   C/D Ratio 0.45 0.4   Macula Hard drusen, no membrane, no macular thickening, no hemorrhage, no exudates Retinal pigment epithelial mottling, Hard drusen, no exudates, no hemorrhage, no macular thickening   Vessels Normal Normal    Periphery Normal, no holes or tears Normal, no holes or tears            IMAGING AND PROCEDURES  Imaging and Procedures for 05/23/21  OCT, Retina - OU - Both Eyes       Right Eye Quality was good. Scan locations included subfoveal. Central Foveal Thickness: 319. Progression has been stable. Findings include abnormal foveal contour, no IRF, no SRF, retinal drusen .   Left Eye Quality was good. Scan locations included subfoveal. Central Foveal Thickness: 260. Progression has improved. Findings include abnormal foveal contour, disciform scar, no IRF, no SRF, retinal drusen .   Notes OD, no recurrence of epiretinal membrane, improved overall as compared to presence of ERM 5 years previous  OS, history of CNVM and subretinal fluid onset January 2020.  No signs of CN VM at this time.  Last injection was some 17 months months previous.  Will observe             ASSESSMENT/PLAN:  Exudative age-related macular degeneration of left eye with inactive choroidal neovascularization (HCC) History of wet AMD OS last injection OS was 17 months previous, no new onset distortions,, and active at this time  Intermediate stage nonexudative age-related macular degeneration of both eyes No history of wet AMD OD, no sign of CNVM daily  History of vitrectomy Stable     ICD-10-CM   1. Exudative age-related macular degeneration of left eye with inactive choroidal neovascularization (HCC)  H35.3222 OCT, Retina - OU - Both Eyes    2. Intermediate stage nonexudative age-related macular degeneration of both eyes  H35.3132 OCT, Retina - OU - Both Eyes    3. Posterior vitreous detachment of left eye  H43.812     4. History of vitrectomy  Z98.890       1.  OD, no signs of complications of AMD  2.  OS no recurrence of CNVM from 2020, with no therapy for last 17 months  3.  Dr. Satira Sark as scheduled  Ophthalmic Meds Ordered this visit:  No orders of the defined types were placed in this  encounter.  Return in about 6 months (around 11/21/2021) for DILATE OU, OCT.  There are no Patient Instructions on file for this visit.   Explained the diagnoses, plan, and follow up with the patient and they expressed understanding.  Patient expressed understanding of the importance of proper follow up care.   Clent Demark Cathlene Gardella M.D. Diseases & Surgery of the Retina and Vitreous Retina & Diabetic [place] 05/23/21     Abbreviations: M myopia (nearsighted); A astigmatism; H hyperopia (farsighted); P presbyopia; Mrx spectacle prescription;  CTL contact lenses; OD right eye; OS left eye; OU both eyes  XT exotropia; ET esotropia; PEK punctate epithelial keratitis; PEE punctate epithelial erosions; DES dry eye syndrome; MGD meibomian gland dysfunction; ATs artificial tears; PFAT's preservative free artificial tears; [place] nuclear sclerotic cataract; PSC posterior subcapsular cataract; ERM epi-retinal membrane; PVD posterior vitreous detachment; RD retinal detachment; DM diabetes mellitus; DR diabetic retinopathy; NPDR non-proliferative diabetic retinopathy; PDR proliferative diabetic retinopathy; CSME clinically significant macular edema; DME diabetic macular edema; dbh dot blot hemorrhages; CWS cotton wool spot; POAG primary open angle glaucoma; C/D cup-to-disc ratio; HVF humphrey visual field; GVF goldmann visual field; OCT optical coherence tomography; IOP intraocular pressure; BRVO Branch retinal vein occlusion; CRVO central retinal vein occlusion; CRAO central retinal artery occlusion; BRAO branch retinal artery occlusion; RT retinal tear; SB scleral buckle; PPV pars plana vitrectomy; VH Vitreous hemorrhage; PRP panretinal laser photocoagulation; IVK intravitreal kenalog; VMT vitreomacular traction; MH Macular hole;  NVD neovascularization of the disc; NVE neovascularization elsewhere; AREDS age related eye disease study; ARMD age related macular degeneration; POAG primary open angle glaucoma;  EBMD epithelial/anterior basement membrane dystrophy; ACIOL anterior chamber intraocular lens; IOL intraocular lens; PCIOL posterior chamber intraocular lens; Phaco/IOL phacoemulsification with intraocular lens placement; [place] photorefractive keratectomy; LASIK laser assisted in situ keratomileusis; HTN hypertension; DM diabetes mellitus; COPD chronic obstructive pulmonary disease

## 2021-05-23 NOTE — Assessment & Plan Note (Deleted)
No history of CNVM in the past

## 2021-05-23 NOTE — Assessment & Plan Note (Signed)
Stable

## 2021-05-23 NOTE — Assessment & Plan Note (Signed)
No history of wet AMD OD, no sign of CNVM daily

## 2021-05-24 DIAGNOSIS — E559 Vitamin D deficiency, unspecified: Secondary | ICD-10-CM | POA: Diagnosis not present

## 2021-05-24 DIAGNOSIS — E7800 Pure hypercholesterolemia, unspecified: Secondary | ICD-10-CM | POA: Diagnosis not present

## 2021-05-24 DIAGNOSIS — R7301 Impaired fasting glucose: Secondary | ICD-10-CM | POA: Diagnosis not present

## 2021-05-30 DIAGNOSIS — M65819 Other synovitis and tenosynovitis, unspecified shoulder: Secondary | ICD-10-CM | POA: Diagnosis not present

## 2021-05-30 DIAGNOSIS — K219 Gastro-esophageal reflux disease without esophagitis: Secondary | ICD-10-CM | POA: Diagnosis not present

## 2021-05-30 DIAGNOSIS — Z0000 Encounter for general adult medical examination without abnormal findings: Secondary | ICD-10-CM | POA: Diagnosis not present

## 2021-05-30 DIAGNOSIS — K648 Other hemorrhoids: Secondary | ICD-10-CM | POA: Diagnosis not present

## 2021-05-30 DIAGNOSIS — H3530 Unspecified macular degeneration: Secondary | ICD-10-CM | POA: Diagnosis not present

## 2021-05-30 DIAGNOSIS — Z1331 Encounter for screening for depression: Secondary | ICD-10-CM | POA: Diagnosis not present

## 2021-05-30 DIAGNOSIS — R7301 Impaired fasting glucose: Secondary | ICD-10-CM | POA: Diagnosis not present

## 2021-05-30 DIAGNOSIS — M8580 Other specified disorders of bone density and structure, unspecified site: Secondary | ICD-10-CM | POA: Diagnosis not present

## 2021-05-30 DIAGNOSIS — E559 Vitamin D deficiency, unspecified: Secondary | ICD-10-CM | POA: Diagnosis not present

## 2021-05-30 DIAGNOSIS — R82998 Other abnormal findings in urine: Secondary | ICD-10-CM | POA: Diagnosis not present

## 2021-05-30 DIAGNOSIS — Z800 Family history of malignant neoplasm of digestive organs: Secondary | ICD-10-CM | POA: Diagnosis not present

## 2021-05-30 DIAGNOSIS — Z1339 Encounter for screening examination for other mental health and behavioral disorders: Secondary | ICD-10-CM | POA: Diagnosis not present

## 2021-05-30 DIAGNOSIS — E7800 Pure hypercholesterolemia, unspecified: Secondary | ICD-10-CM | POA: Diagnosis not present

## 2021-08-08 DIAGNOSIS — L281 Prurigo nodularis: Secondary | ICD-10-CM | POA: Diagnosis not present

## 2021-08-08 DIAGNOSIS — L814 Other melanin hyperpigmentation: Secondary | ICD-10-CM | POA: Diagnosis not present

## 2021-08-08 DIAGNOSIS — D225 Melanocytic nevi of trunk: Secondary | ICD-10-CM | POA: Diagnosis not present

## 2021-08-08 DIAGNOSIS — D1801 Hemangioma of skin and subcutaneous tissue: Secondary | ICD-10-CM | POA: Diagnosis not present

## 2021-08-08 DIAGNOSIS — L821 Other seborrheic keratosis: Secondary | ICD-10-CM | POA: Diagnosis not present

## 2021-08-18 DIAGNOSIS — H1132 Conjunctival hemorrhage, left eye: Secondary | ICD-10-CM | POA: Diagnosis not present

## 2021-10-12 ENCOUNTER — Other Ambulatory Visit: Payer: Self-pay | Admitting: Internal Medicine

## 2021-10-12 DIAGNOSIS — Z1231 Encounter for screening mammogram for malignant neoplasm of breast: Secondary | ICD-10-CM

## 2021-10-13 ENCOUNTER — Ambulatory Visit
Admission: RE | Admit: 2021-10-13 | Discharge: 2021-10-13 | Disposition: A | Discharge status: Home / Self Care | Payer: Medicare HMO | Source: Ambulatory Visit | Attending: Internal Medicine | Admitting: Internal Medicine

## 2021-10-13 DIAGNOSIS — Z1231 Encounter for screening mammogram for malignant neoplasm of breast: Secondary | ICD-10-CM | POA: Diagnosis not present

## 2021-11-15 IMAGING — MG DIGITAL SCREENING BILAT W/ TOMO W/ CAD
8 series · 8 of 24 positions shown · non-contrast
Comparison: Previous exam(s).

CLINICAL DATA: Screening.

EXAM:
DIGITAL SCREENING BILATERAL MAMMOGRAM WITH TOMO AND CAD

[R CC synth-2D]
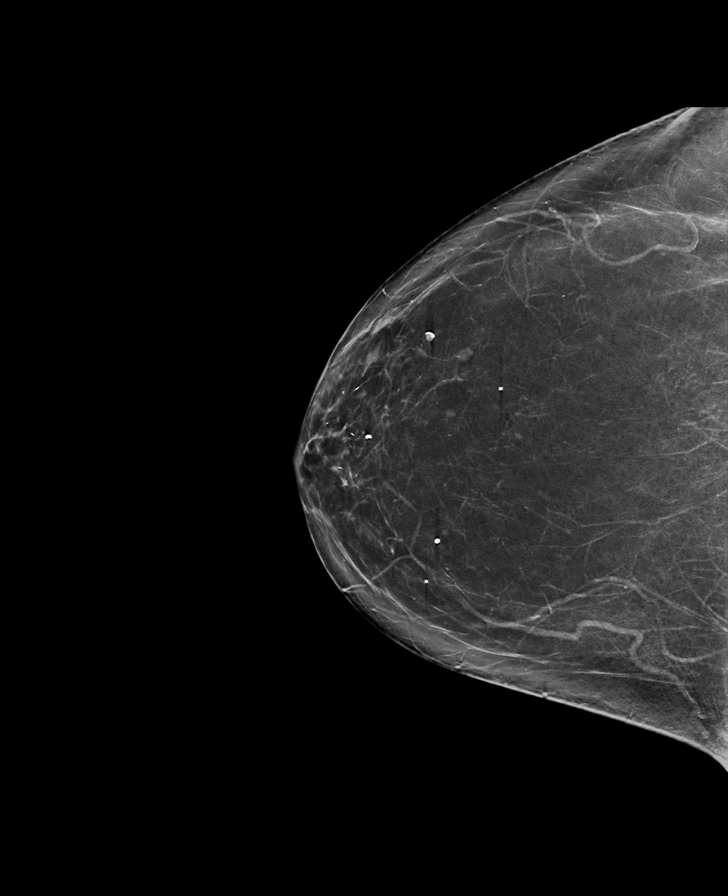

[L CC synth-2D]
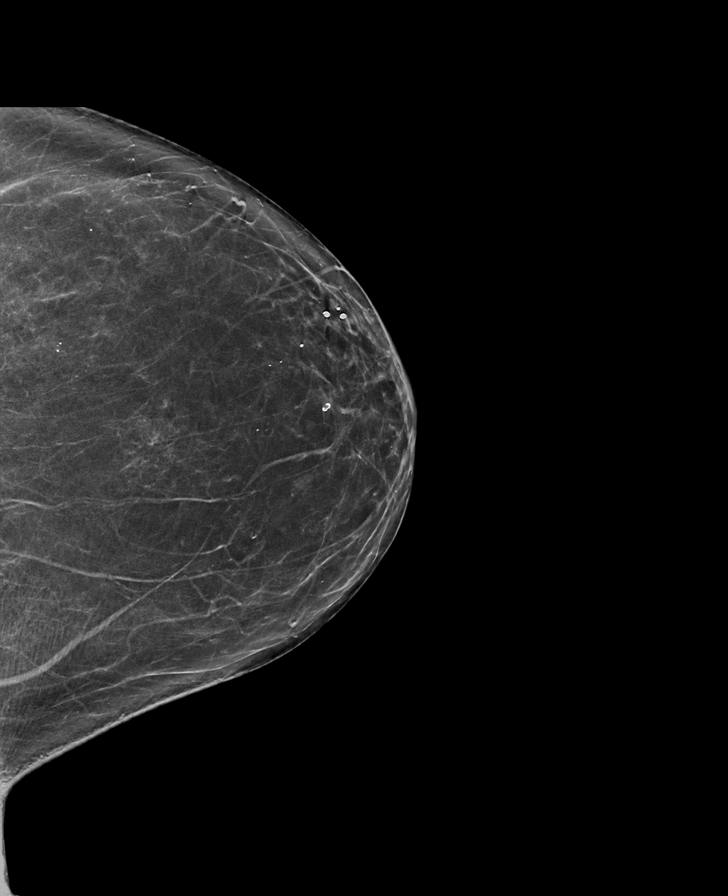

[L MLO synth-2D]
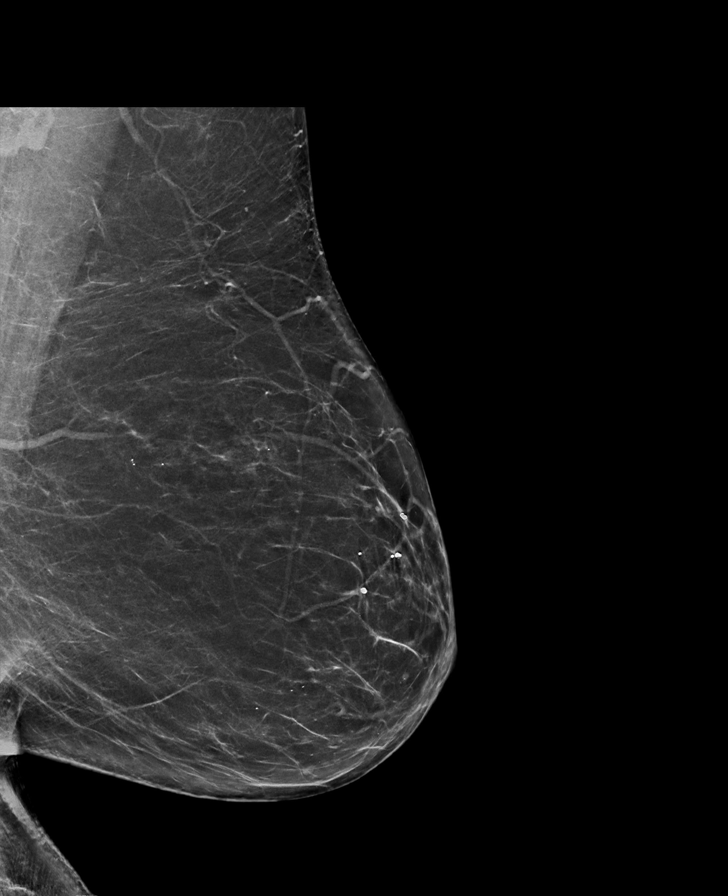

[R MLO synth-2D]
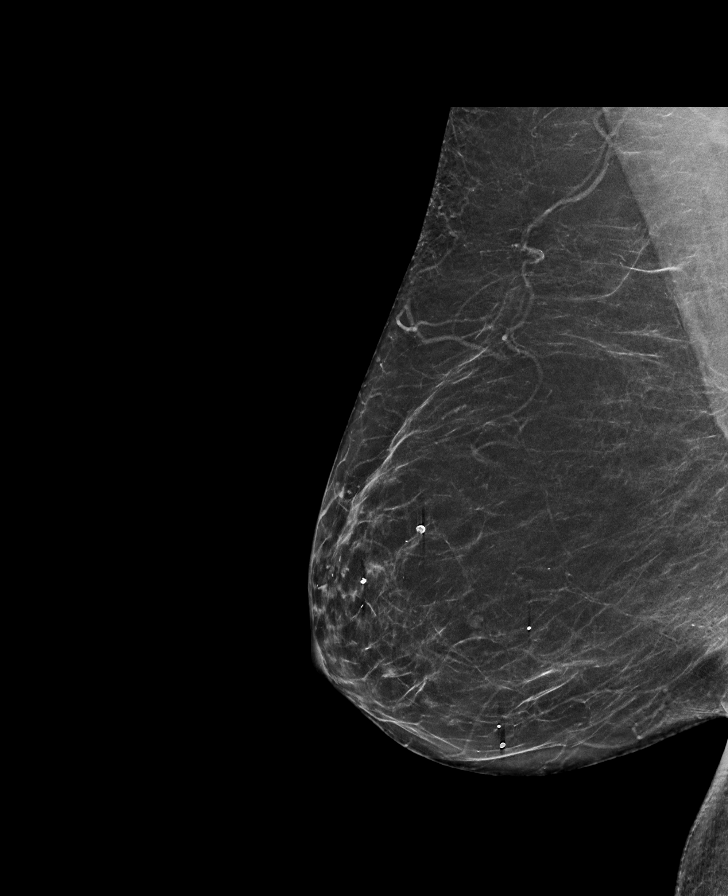

[R MLO tomo · tomo slice 37/74.0]
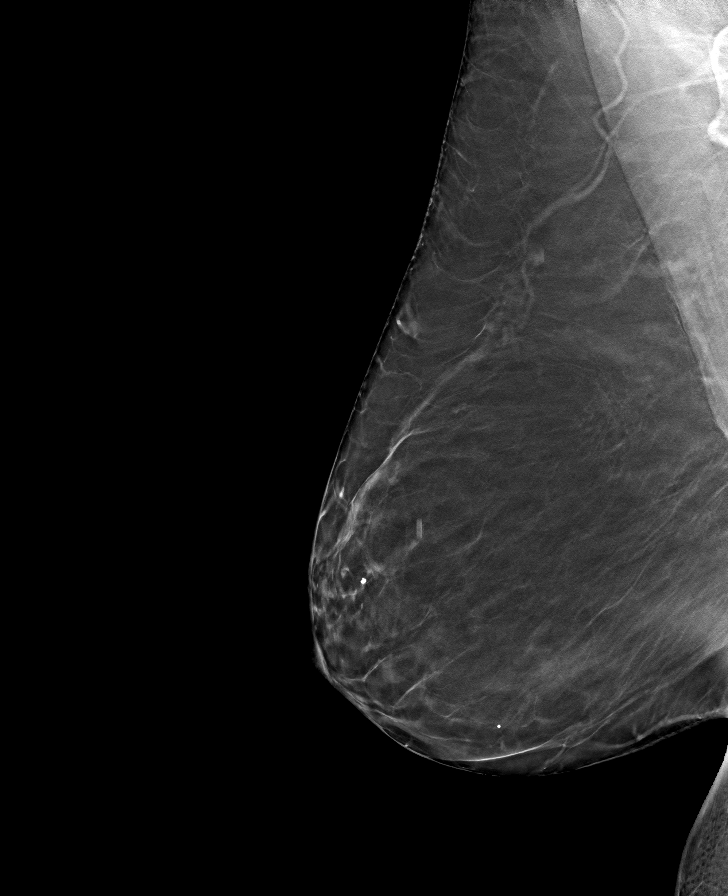

[R CC tomo · tomo slice 37/72.0]
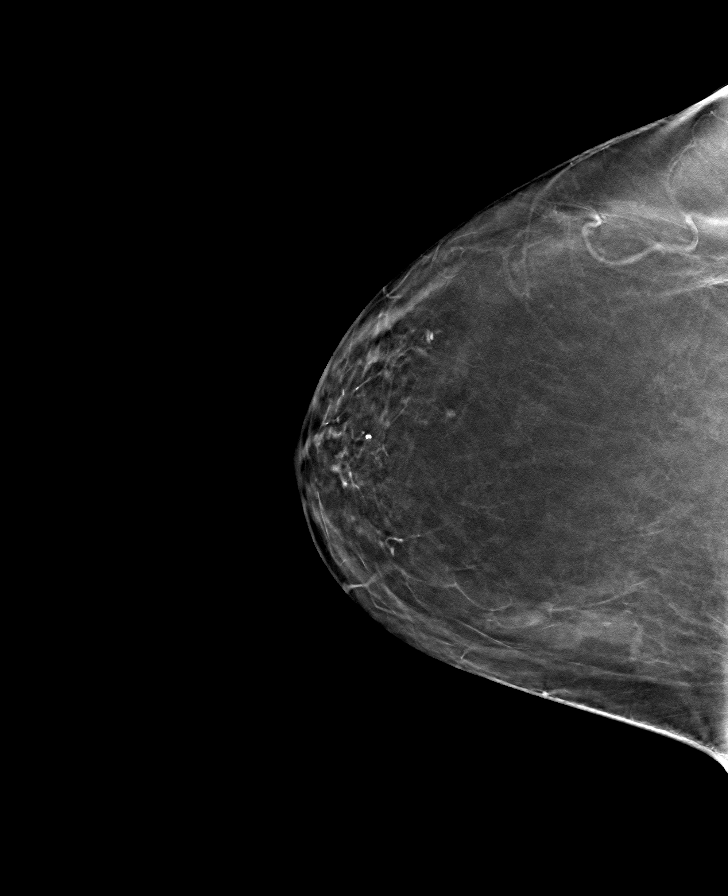

[L CC tomo · tomo slice 35/70.0]
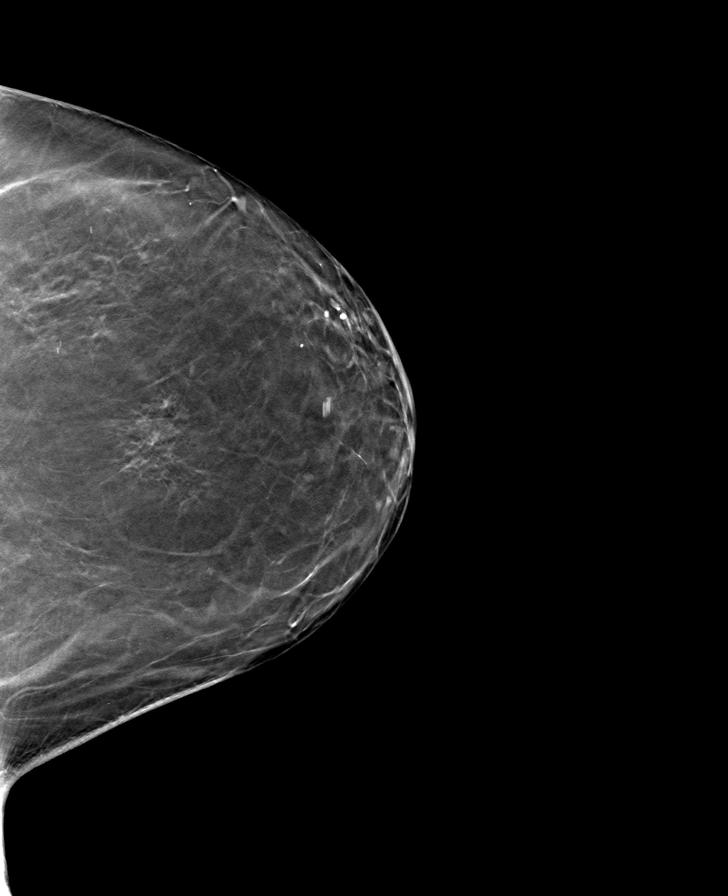

[L MLO tomo · tomo slice 39/77.0]
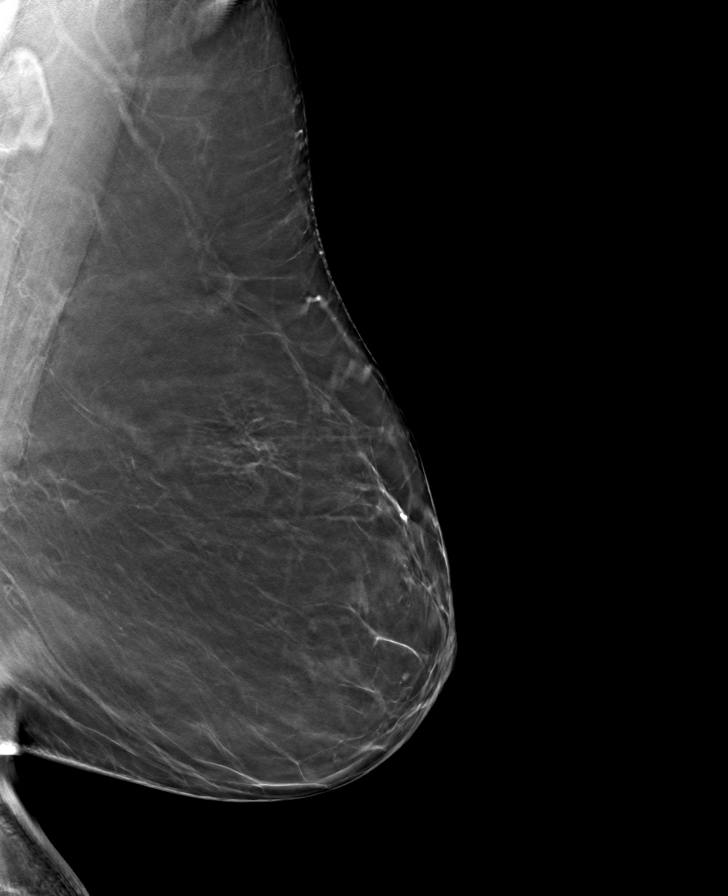

[8 of 24 positions shown; findings below may reference images not displayed]

ACR Breast Density Category b: There are scattered areas of
fibroglandular density.
FINDINGS: There are no findings suspicious for malignancy. Images were
processed with CAD.
IMPRESSION: No mammographic evidence of malignancy. A result letter of this
screening mammogram will be mailed directly to the patient.

RECOMMENDATION:
Screening mammogram in one year. (Code:[id number])

BI-RADS CATEGORY  1: Negative.

## 2021-11-21 ENCOUNTER — Encounter (INDEPENDENT_AMBULATORY_CARE_PROVIDER_SITE_OTHER): Payer: Medicare HMO | Admitting: Ophthalmology

## 2021-11-21 ENCOUNTER — Encounter (INDEPENDENT_AMBULATORY_CARE_PROVIDER_SITE_OTHER): Payer: Self-pay | Admitting: Ophthalmology

## 2021-11-21 ENCOUNTER — Ambulatory Visit (INDEPENDENT_AMBULATORY_CARE_PROVIDER_SITE_OTHER): Payer: Medicare HMO | Admitting: Ophthalmology

## 2021-11-21 DIAGNOSIS — Z961 Presence of intraocular lens: Secondary | ICD-10-CM

## 2021-11-21 DIAGNOSIS — H353222 Exudative age-related macular degeneration, left eye, with inactive choroidal neovascularization: Secondary | ICD-10-CM | POA: Diagnosis not present

## 2021-11-21 DIAGNOSIS — H353132 Nonexudative age-related macular degeneration, bilateral, intermediate dry stage: Secondary | ICD-10-CM | POA: Diagnosis not present

## 2021-11-21 DIAGNOSIS — H35371 Puckering of macula, right eye: Secondary | ICD-10-CM | POA: Diagnosis not present

## 2021-11-21 NOTE — Assessment & Plan Note (Signed)
No sign of CNVM or recurrence over the last 23 months

## 2021-11-21 NOTE — Assessment & Plan Note (Signed)
Looks great OU stable

## 2021-11-21 NOTE — Progress Notes (Signed)
11/21/2021     CHIEF COMPLAINT Patient presents for  Chief Complaint  Patient presents with   Macular Degeneration      HISTORY OF PRESENT ILLNESS: Jamie Duncan is a 81 y.o. female who presents to the clinic today for:   HPI   6 mos fu Dilate OU OCT. Patient states vision is stable and unchanged since last visit. Denies any new floaters or FOL.   Last edited by Laurin Coder on 11/21/2021  1:07 PM.      Referring physician: Marygrace Drought, MD [address] [place],  [place] [address]  HISTORICAL INFORMATION:   Selected notes from the MEDICAL RECORD NUMBER       CURRENT MEDICATIONS: No current outpatient medications on file. (Ophthalmic Drugs)   No current facility-administered medications for this visit. (Ophthalmic Drugs)   Current Outpatient Medications (Other)  Medication Sig   Ascorbic Acid (VITAMIN C PO) Take by mouth.   Calcium Carbonate-Vit D-Min (CALTRATE PLUS PO) Take by mouth.   fish oil-omega-3 fatty acids 1000 MG capsule Take 1 g by mouth daily.   Multiple Vitamins-Minerals (ICAPS AREDS 2 PO) Take by mouth.   multivitamin (THERAGRAN) per tablet Take 1 tablet by mouth daily.   omeprazole (PRILOSEC) 20 MG capsule Take 20 mg by mouth daily.   Psyllium (METAMUCIL PO) Take by mouth daily.   No current facility-administered medications for this visit. (Other)      REVIEW OF SYSTEMS: ROS   Negative for: Constitutional, Gastrointestinal, Neurological, Skin, Genitourinary, Musculoskeletal, HENT, Endocrine, Cardiovascular, Eyes, Respiratory, Psychiatric, Allergic/Imm, Heme/Lymph Last edited by Laurin Coder on 11/21/2021  1:07 PM.       ALLERGIES No Known Allergies  PAST MEDICAL HISTORY Past Medical History:  Diagnosis Date   Cataract    Cataract    GERD (gastroesophageal reflux disease)    Hypercholesteremia    Past Surgical History:  Procedure Laterality Date   CATARACT EXTRACTION     bilateral   COLONOSCOPY  multiple   EYE  SURGERY     TONSILLECTOMY     WISDOM TOOTH EXTRACTION      FAMILY HISTORY Family History  Problem Relation Age of Onset   Colon cancer Mother 87   Diabetes Mother    Diabetes Father    Hypertension Father    Esophageal cancer Maternal Uncle    Esophageal cancer Cousin        Maternal cousin   Stomach cancer Neg Hx    Rectal cancer Neg Hx    Breast cancer Neg Hx     SOCIAL HISTORY Social History   Tobacco Use   Smoking status: Former   Smokeless tobacco: Never  Scientific laboratory technician Use: Never used  Substance Use Topics   Alcohol use: Not Currently   Drug use: No         OPHTHALMIC EXAM:  Base Eye Exam     Visual Acuity (ETDRS)       Right Left   Dist cc 20/30 -2 20/20   Dist ph cc NI     Correction: Glasses         Tonometry (Tonopen, 1:09 PM)       Right Left   Pressure 16 16         Pupils       Pupils Dark Light APD   Right PERRL 4 3 None   Left PERRL 4 3 None         Extraocular Movement  Right Left    Full Full         Neuro/Psych     Oriented x3: Yes   Mood/Affect: Normal         Dilation     Both eyes: 1.0% Mydriacyl, 2.5% Phenylephrine @ 1:09 PM           Slit Lamp and Fundus Exam     External Exam       Right Left   External Normal Normal         Slit Lamp Exam       Right Left   Lids/Lashes Normal Normal   Conjunctiva/Sclera White and quiet White and quiet   Cornea Clear Clear   Anterior Chamber Deep and quiet Deep and quiet   Iris Round and reactive Round and reactive   Lens Centered posterior chamber intraocular lens Centered posterior chamber intraocular lens   Anterior Vitreous Normal Normal         Fundus Exam       Right Left   Posterior Vitreous Clear vitrectomized Posterior vitreous detachment, Central vitreous floaters   Disc Normal Normal. peripapillary atrophy   C/D Ratio 0.45 0.4   Macula Hard drusen, no membrane, no macular thickening, no hemorrhage, no exudates Retinal  pigment epithelial mottling, Hard drusen, no exudates, no hemorrhage, no macular thickening   Vessels Normal Normal   Periphery Normal, no holes or tears Normal, no holes or tears            IMAGING AND PROCEDURES  Imaging and Procedures for 11/21/21  OCT, Retina - OU - Both Eyes       Right Eye Quality was good. Scan locations included subfoveal. Central Foveal Thickness: 314. Progression has been stable. Findings include no IRF, no SRF, abnormal foveal contour, retinal drusen .   Left Eye Quality was good. Scan locations included subfoveal. Central Foveal Thickness: 260. Progression has improved. Findings include no IRF, no SRF, abnormal foveal contour, retinal drusen , disciform scar.   Notes OD, no recurrence of epiretinal membrane, improved overall as compared to presence of ERM 5 years previous  OS, history of CNVM and subretinal fluid onset January 2020.  No signs of CN VM at this time.  Last injection was some 23 months months previous.  Will observe             ASSESSMENT/PLAN:  Exudative age-related macular degeneration of left eye with inactive choroidal neovascularization (HCC) No sign of CNVM or recurrence over the last 23 months  Intermediate stage nonexudative age-related macular degeneration of both eyes Stable OD no sign of CNVM  Pseudophakia, both eyes Looks great OU stable     ICD-10-CM   1. Exudative age-related macular degeneration of left eye with inactive choroidal neovascularization (HCC)  H35.3222 OCT, Retina - OU - Both Eyes    2. Intermediate stage nonexudative age-related macular degeneration of both eyes  H35.3132     3. Right epiretinal membrane  H35.371     4. Pseudophakia, both eyes  Z96.1       1.  OU with intermediate ARMD.  No sign of recurrence of CNVM over the last 23 months.  We will continue to monitor and observe  2.  OD subfoveal drusen, no change over time.  Stable acuity.  No sign of CNVM.  History of vitrectomy for  epiretinal membrane  3.  Ophthalmic Meds Ordered this visit:  No orders of the defined types were placed in this encounter.  Return in about 1 year (around 11/22/2022) for COLOR FP, OCT, DILATE OU.  There are no Patient Instructions on file for this visit.   Explained the diagnoses, plan, and follow up with the patient and they expressed understanding.  Patient expressed understanding of the importance of proper follow up care.   Clent Demark Glorine Hanratty M.D. Diseases & Surgery of the Retina and Vitreous [facility] 11/21/21     Abbreviations: M myopia (nearsighted); A astigmatism; H hyperopia (farsighted); P presbyopia; Mrx spectacle prescription;  CTL contact lenses; OD right eye; OS left eye; OU both eyes  XT exotropia; ET esotropia; PEK punctate epithelial keratitis; PEE punctate epithelial erosions; DES dry eye syndrome; MGD meibomian gland dysfunction; ATs artificial tears; PFAT's preservative free artificial tears; [place] nuclear sclerotic cataract; PSC posterior subcapsular cataract; ERM epi-retinal membrane; PVD posterior vitreous detachment; RD retinal detachment; DM diabetes mellitus; DR diabetic retinopathy; NPDR non-proliferative diabetic retinopathy; PDR proliferative diabetic retinopathy; CSME clinically significant macular edema; DME diabetic macular edema; dbh dot blot hemorrhages; CWS cotton wool spot; POAG primary open angle glaucoma; C/D cup-to-disc ratio; HVF humphrey visual field; GVF goldmann visual field; OCT optical coherence tomography; IOP intraocular pressure; BRVO Branch retinal vein occlusion; CRVO central retinal vein occlusion; CRAO central retinal artery occlusion; BRAO branch retinal artery occlusion; RT retinal tear; SB scleral buckle; PPV pars plana vitrectomy; VH Vitreous hemorrhage; PRP panretinal laser photocoagulation; IVK intravitreal kenalog; VMT vitreomacular traction; MH Macular hole;  NVD neovascularization of the disc; NVE  neovascularization elsewhere; AREDS age related eye disease study; ARMD age related macular degeneration; POAG primary open angle glaucoma; EBMD epithelial/anterior basement membrane dystrophy; ACIOL anterior chamber intraocular lens; IOL intraocular lens; PCIOL posterior chamber intraocular lens; Phaco/IOL phacoemulsification with intraocular lens placement; [place] photorefractive keratectomy; LASIK laser assisted in situ keratomileusis; HTN hypertension; DM diabetes mellitus; COPD chronic obstructive pulmonary disease

## 2021-11-21 NOTE — Assessment & Plan Note (Signed)
Stable OD no sign of CNVM

## 2021-11-27 DIAGNOSIS — R7301 Impaired fasting glucose: Secondary | ICD-10-CM | POA: Diagnosis not present

## 2021-11-27 DIAGNOSIS — K219 Gastro-esophageal reflux disease without esophagitis: Secondary | ICD-10-CM | POA: Diagnosis not present

## 2021-11-27 DIAGNOSIS — E7800 Pure hypercholesterolemia, unspecified: Secondary | ICD-10-CM | POA: Diagnosis not present

## 2021-11-27 DIAGNOSIS — Z800 Family history of malignant neoplasm of digestive organs: Secondary | ICD-10-CM | POA: Diagnosis not present

## 2021-11-27 DIAGNOSIS — Z1339 Encounter for screening examination for other mental health and behavioral disorders: Secondary | ICD-10-CM | POA: Diagnosis not present

## 2021-11-27 DIAGNOSIS — M65819 Other synovitis and tenosynovitis, unspecified shoulder: Secondary | ICD-10-CM | POA: Diagnosis not present

## 2021-11-27 DIAGNOSIS — Z1331 Encounter for screening for depression: Secondary | ICD-10-CM | POA: Diagnosis not present

## 2021-11-27 DIAGNOSIS — M8580 Other specified disorders of bone density and structure, unspecified site: Secondary | ICD-10-CM | POA: Diagnosis not present

## 2021-11-27 DIAGNOSIS — H3530 Unspecified macular degeneration: Secondary | ICD-10-CM | POA: Diagnosis not present

## 2021-11-27 DIAGNOSIS — M20001 Unspecified deformity of right finger(s): Secondary | ICD-10-CM | POA: Diagnosis not present

## 2021-11-27 DIAGNOSIS — K648 Other hemorrhoids: Secondary | ICD-10-CM | POA: Diagnosis not present

## 2021-12-22 DIAGNOSIS — H43813 Vitreous degeneration, bilateral: Secondary | ICD-10-CM | POA: Diagnosis not present

## 2021-12-22 DIAGNOSIS — H52203 Unspecified astigmatism, bilateral: Secondary | ICD-10-CM | POA: Diagnosis not present

## 2021-12-22 DIAGNOSIS — H353232 Exudative age-related macular degeneration, bilateral, with inactive choroidal neovascularization: Secondary | ICD-10-CM | POA: Diagnosis not present

## 2021-12-22 DIAGNOSIS — H26492 Other secondary cataract, left eye: Secondary | ICD-10-CM | POA: Diagnosis not present

## 2022-06-06 ENCOUNTER — Encounter: Payer: Self-pay | Admitting: Internal Medicine

## 2022-06-07 DIAGNOSIS — E559 Vitamin D deficiency, unspecified: Secondary | ICD-10-CM | POA: Diagnosis not present

## 2022-06-07 DIAGNOSIS — E7800 Pure hypercholesterolemia, unspecified: Secondary | ICD-10-CM | POA: Diagnosis not present

## 2022-06-07 DIAGNOSIS — R7301 Impaired fasting glucose: Secondary | ICD-10-CM | POA: Diagnosis not present

## 2022-06-07 DIAGNOSIS — R7989 Other specified abnormal findings of blood chemistry: Secondary | ICD-10-CM | POA: Diagnosis not present

## 2022-06-14 DIAGNOSIS — M65819 Other synovitis and tenosynovitis, unspecified shoulder: Secondary | ICD-10-CM | POA: Diagnosis not present

## 2022-06-14 DIAGNOSIS — H3530 Unspecified macular degeneration: Secondary | ICD-10-CM | POA: Diagnosis not present

## 2022-06-14 DIAGNOSIS — K219 Gastro-esophageal reflux disease without esophagitis: Secondary | ICD-10-CM | POA: Diagnosis not present

## 2022-06-14 DIAGNOSIS — Z800 Family history of malignant neoplasm of digestive organs: Secondary | ICD-10-CM | POA: Diagnosis not present

## 2022-06-14 DIAGNOSIS — M8580 Other specified disorders of bone density and structure, unspecified site: Secondary | ICD-10-CM | POA: Diagnosis not present

## 2022-06-14 DIAGNOSIS — E7800 Pure hypercholesterolemia, unspecified: Secondary | ICD-10-CM | POA: Diagnosis not present

## 2022-06-14 DIAGNOSIS — Z1331 Encounter for screening for depression: Secondary | ICD-10-CM | POA: Diagnosis not present

## 2022-06-14 DIAGNOSIS — R7301 Impaired fasting glucose: Secondary | ICD-10-CM | POA: Diagnosis not present

## 2022-06-14 DIAGNOSIS — K648 Other hemorrhoids: Secondary | ICD-10-CM | POA: Diagnosis not present

## 2022-06-14 DIAGNOSIS — E559 Vitamin D deficiency, unspecified: Secondary | ICD-10-CM | POA: Diagnosis not present

## 2022-06-14 DIAGNOSIS — Z1339 Encounter for screening examination for other mental health and behavioral disorders: Secondary | ICD-10-CM | POA: Diagnosis not present

## 2022-06-14 DIAGNOSIS — M545 Low back pain, unspecified: Secondary | ICD-10-CM | POA: Diagnosis not present

## 2022-06-14 DIAGNOSIS — R82998 Other abnormal findings in urine: Secondary | ICD-10-CM | POA: Diagnosis not present

## 2022-06-14 DIAGNOSIS — Z0000 Encounter for general adult medical examination without abnormal findings: Secondary | ICD-10-CM | POA: Diagnosis not present

## 2022-06-14 DIAGNOSIS — M20001 Unspecified deformity of right finger(s): Secondary | ICD-10-CM | POA: Diagnosis not present

## 2022-08-18 DIAGNOSIS — Z1211 Encounter for screening for malignant neoplasm of colon: Secondary | ICD-10-CM | POA: Diagnosis not present

## 2022-08-18 DIAGNOSIS — Z1212 Encounter for screening for malignant neoplasm of rectum: Secondary | ICD-10-CM | POA: Diagnosis not present

## 2022-10-01 DIAGNOSIS — D225 Melanocytic nevi of trunk: Secondary | ICD-10-CM | POA: Diagnosis not present

## 2022-10-01 DIAGNOSIS — L821 Other seborrheic keratosis: Secondary | ICD-10-CM | POA: Diagnosis not present

## 2022-10-01 DIAGNOSIS — L814 Other melanin hyperpigmentation: Secondary | ICD-10-CM | POA: Diagnosis not present

## 2022-10-19 ENCOUNTER — Other Ambulatory Visit: Payer: Self-pay | Admitting: Internal Medicine

## 2022-10-19 DIAGNOSIS — Z1231 Encounter for screening mammogram for malignant neoplasm of breast: Secondary | ICD-10-CM

## 2022-10-31 ENCOUNTER — Ambulatory Visit
Admission: RE | Admit: 2022-10-31 | Discharge: 2022-10-31 | Disposition: A | Discharge status: Home / Self Care | Payer: Medicare HMO | Source: Ambulatory Visit | Attending: Internal Medicine | Admitting: Internal Medicine

## 2022-10-31 DIAGNOSIS — Z1231 Encounter for screening mammogram for malignant neoplasm of breast: Secondary | ICD-10-CM | POA: Diagnosis not present

## 2022-11-26 ENCOUNTER — Encounter (INDEPENDENT_AMBULATORY_CARE_PROVIDER_SITE_OTHER): Payer: Medicare HMO | Admitting: Ophthalmology

## 2022-11-26 ENCOUNTER — Encounter (INDEPENDENT_AMBULATORY_CARE_PROVIDER_SITE_OTHER): Payer: Self-pay

## 2022-11-26 DIAGNOSIS — H353132 Nonexudative age-related macular degeneration, bilateral, intermediate dry stage: Secondary | ICD-10-CM | POA: Diagnosis not present

## 2022-11-26 DIAGNOSIS — H43812 Vitreous degeneration, left eye: Secondary | ICD-10-CM | POA: Diagnosis not present

## 2022-11-26 DIAGNOSIS — H353222 Exudative age-related macular degeneration, left eye, with inactive choroidal neovascularization: Secondary | ICD-10-CM | POA: Diagnosis not present

## 2022-12-25 DIAGNOSIS — H26493 Other secondary cataract, bilateral: Secondary | ICD-10-CM | POA: Diagnosis not present

## 2022-12-25 DIAGNOSIS — H52203 Unspecified astigmatism, bilateral: Secondary | ICD-10-CM | POA: Diagnosis not present

## 2022-12-25 DIAGNOSIS — H524 Presbyopia: Secondary | ICD-10-CM | POA: Diagnosis not present

## 2022-12-25 DIAGNOSIS — H04123 Dry eye syndrome of bilateral lacrimal glands: Secondary | ICD-10-CM | POA: Diagnosis not present

## 2022-12-25 DIAGNOSIS — H353132 Nonexudative age-related macular degeneration, bilateral, intermediate dry stage: Secondary | ICD-10-CM | POA: Diagnosis not present

## 2022-12-26 DIAGNOSIS — Z0101 Encounter for examination of eyes and vision with abnormal findings: Secondary | ICD-10-CM | POA: Diagnosis not present

## 2023-07-18 DIAGNOSIS — E7800 Pure hypercholesterolemia, unspecified: Secondary | ICD-10-CM | POA: Diagnosis not present

## 2023-07-18 DIAGNOSIS — R7301 Impaired fasting glucose: Secondary | ICD-10-CM | POA: Diagnosis not present

## 2023-07-18 DIAGNOSIS — E559 Vitamin D deficiency, unspecified: Secondary | ICD-10-CM | POA: Diagnosis not present

## 2023-07-18 DIAGNOSIS — E785 Hyperlipidemia, unspecified: Secondary | ICD-10-CM | POA: Diagnosis not present

## 2023-07-25 DIAGNOSIS — E7800 Pure hypercholesterolemia, unspecified: Secondary | ICD-10-CM | POA: Diagnosis not present

## 2023-07-25 DIAGNOSIS — K219 Gastro-esophageal reflux disease without esophagitis: Secondary | ICD-10-CM | POA: Diagnosis not present

## 2023-07-25 DIAGNOSIS — M20001 Unspecified deformity of right finger(s): Secondary | ICD-10-CM | POA: Diagnosis not present

## 2023-07-25 DIAGNOSIS — K648 Other hemorrhoids: Secondary | ICD-10-CM | POA: Diagnosis not present

## 2023-07-25 DIAGNOSIS — H3530 Unspecified macular degeneration: Secondary | ICD-10-CM | POA: Diagnosis not present

## 2023-07-25 DIAGNOSIS — R7301 Impaired fasting glucose: Secondary | ICD-10-CM | POA: Diagnosis not present

## 2023-07-25 DIAGNOSIS — Z0000 Encounter for general adult medical examination without abnormal findings: Secondary | ICD-10-CM | POA: Diagnosis not present

## 2023-07-25 DIAGNOSIS — Z800 Family history of malignant neoplasm of digestive organs: Secondary | ICD-10-CM | POA: Diagnosis not present

## 2023-07-25 DIAGNOSIS — M8580 Other specified disorders of bone density and structure, unspecified site: Secondary | ICD-10-CM | POA: Diagnosis not present

## 2023-07-25 DIAGNOSIS — Z1339 Encounter for screening examination for other mental health and behavioral disorders: Secondary | ICD-10-CM | POA: Diagnosis not present

## 2023-07-25 DIAGNOSIS — Z1331 Encounter for screening for depression: Secondary | ICD-10-CM | POA: Diagnosis not present

## 2023-07-25 DIAGNOSIS — H9190 Unspecified hearing loss, unspecified ear: Secondary | ICD-10-CM | POA: Diagnosis not present

## 2023-07-25 DIAGNOSIS — R82998 Other abnormal findings in urine: Secondary | ICD-10-CM | POA: Diagnosis not present

## 2023-09-02 DIAGNOSIS — L820 Inflamed seborrheic keratosis: Secondary | ICD-10-CM | POA: Diagnosis not present

## 2023-09-02 DIAGNOSIS — M6740 Ganglion, unspecified site: Secondary | ICD-10-CM | POA: Diagnosis not present

## 2023-09-20 ENCOUNTER — Other Ambulatory Visit: Payer: Self-pay | Admitting: Internal Medicine

## 2023-09-20 DIAGNOSIS — Z0000 Encounter for general adult medical examination without abnormal findings: Secondary | ICD-10-CM

## 2023-11-01 ENCOUNTER — Ambulatory Visit
Admission: RE | Admit: 2023-11-01 | Discharge: 2023-11-01 | Disposition: A | Discharge status: Home / Self Care | Source: Ambulatory Visit | Attending: Internal Medicine | Admitting: Internal Medicine

## 2023-11-01 DIAGNOSIS — Z0000 Encounter for general adult medical examination without abnormal findings: Secondary | ICD-10-CM

## 2023-11-01 DIAGNOSIS — Z1231 Encounter for screening mammogram for malignant neoplasm of breast: Secondary | ICD-10-CM | POA: Diagnosis not present

## 2023-11-26 DIAGNOSIS — Z9889 Other specified postprocedural states: Secondary | ICD-10-CM | POA: Diagnosis not present

## 2023-11-26 DIAGNOSIS — H353222 Exudative age-related macular degeneration, left eye, with inactive choroidal neovascularization: Secondary | ICD-10-CM | POA: Diagnosis not present

## 2023-11-26 DIAGNOSIS — H353132 Nonexudative age-related macular degeneration, bilateral, intermediate dry stage: Secondary | ICD-10-CM | POA: Diagnosis not present

## 2023-11-26 DIAGNOSIS — H43812 Vitreous degeneration, left eye: Secondary | ICD-10-CM | POA: Diagnosis not present

## 2023-12-10 DIAGNOSIS — D225 Melanocytic nevi of trunk: Secondary | ICD-10-CM | POA: Diagnosis not present

## 2023-12-10 DIAGNOSIS — L821 Other seborrheic keratosis: Secondary | ICD-10-CM | POA: Diagnosis not present

## 2023-12-10 DIAGNOSIS — L820 Inflamed seborrheic keratosis: Secondary | ICD-10-CM | POA: Diagnosis not present

## 2023-12-10 DIAGNOSIS — D2262 Melanocytic nevi of left upper limb, including shoulder: Secondary | ICD-10-CM | POA: Diagnosis not present

## 2023-12-10 DIAGNOSIS — D485 Neoplasm of uncertain behavior of skin: Secondary | ICD-10-CM | POA: Diagnosis not present

## 2023-12-26 DIAGNOSIS — H524 Presbyopia: Secondary | ICD-10-CM | POA: Diagnosis not present

## 2023-12-26 DIAGNOSIS — H353132 Nonexudative age-related macular degeneration, bilateral, intermediate dry stage: Secondary | ICD-10-CM | POA: Diagnosis not present

## 2023-12-26 DIAGNOSIS — H52203 Unspecified astigmatism, bilateral: Secondary | ICD-10-CM | POA: Diagnosis not present

## 2023-12-26 DIAGNOSIS — H26493 Other secondary cataract, bilateral: Secondary | ICD-10-CM | POA: Diagnosis not present

## 2023-12-26 DIAGNOSIS — H04123 Dry eye syndrome of bilateral lacrimal glands: Secondary | ICD-10-CM | POA: Diagnosis not present

## 2024-03-24 DIAGNOSIS — H10502 Unspecified blepharoconjunctivitis, left eye: Secondary | ICD-10-CM | POA: Diagnosis not present

## 2024-04-08 DIAGNOSIS — H10412 Chronic giant papillary conjunctivitis, left eye: Secondary | ICD-10-CM | POA: Diagnosis not present

## 2024-05-19 DIAGNOSIS — H353222 Exudative age-related macular degeneration, left eye, with inactive choroidal neovascularization: Secondary | ICD-10-CM | POA: Diagnosis not present

## 2024-05-19 DIAGNOSIS — H43812 Vitreous degeneration, left eye: Secondary | ICD-10-CM | POA: Diagnosis not present

## 2024-05-19 DIAGNOSIS — H353132 Nonexudative age-related macular degeneration, bilateral, intermediate dry stage: Secondary | ICD-10-CM | POA: Diagnosis not present
